# Patient Record
Sex: Male | Born: 1950 | Race: White | Hispanic: No | Marital: Married | Smoking: Never smoker
Health system: Southern US, Community
[De-identification: ages and names within clinical notes are randomized; demographics above are authoritative.]

## PROBLEM LIST (undated history)

## (undated) DIAGNOSIS — N529 Male erectile dysfunction, unspecified: Secondary | ICD-10-CM

## (undated) DIAGNOSIS — N433 Hydrocele, unspecified: Secondary | ICD-10-CM

## (undated) DIAGNOSIS — R001 Bradycardia, unspecified: Secondary | ICD-10-CM

## (undated) DIAGNOSIS — N486 Induration penis plastica: Secondary | ICD-10-CM

## (undated) DIAGNOSIS — H9190 Unspecified hearing loss, unspecified ear: Secondary | ICD-10-CM

## (undated) DIAGNOSIS — F419 Anxiety disorder, unspecified: Secondary | ICD-10-CM

## (undated) DIAGNOSIS — E7800 Pure hypercholesterolemia, unspecified: Secondary | ICD-10-CM

## (undated) DIAGNOSIS — F430 Acute stress reaction: Secondary | ICD-10-CM

## (undated) HISTORY — DX: Male erectile dysfunction, unspecified: N52.9

## (undated) HISTORY — DX: Anxiety disorder, unspecified: F41.9

## (undated) HISTORY — DX: Unspecified hearing loss, unspecified ear: H91.90

## (undated) HISTORY — DX: Acute stress reaction: F43.0

## (undated) HISTORY — DX: Pure hypercholesterolemia, unspecified: E78.00

## (undated) HISTORY — DX: Hydrocele, unspecified: N43.3

## (undated) HISTORY — DX: Induration penis plastica: N48.6

## (undated) HISTORY — DX: Bradycardia, unspecified: R00.1

---

## 2004-07-28 ENCOUNTER — Ambulatory Visit: Payer: Self-pay | Admitting: Gastroenterology

## 2004-08-18 ENCOUNTER — Ambulatory Visit: Payer: Self-pay | Admitting: Gastroenterology

## 2005-12-01 DIAGNOSIS — C4492 Squamous cell carcinoma of skin, unspecified: Secondary | ICD-10-CM

## 2005-12-01 HISTORY — DX: Squamous cell carcinoma of skin, unspecified: C44.92

## 2006-08-20 ENCOUNTER — Ambulatory Visit (HOSPITAL_BASED_OUTPATIENT_CLINIC_OR_DEPARTMENT_OTHER): Admission: RE | Admit: 2006-08-20 | Discharge: 2006-08-20 | Payer: Self-pay | Admitting: Urology

## 2009-07-01 ENCOUNTER — Encounter (INDEPENDENT_AMBULATORY_CARE_PROVIDER_SITE_OTHER): Payer: Self-pay | Admitting: *Deleted

## 2009-07-23 ENCOUNTER — Encounter (INDEPENDENT_AMBULATORY_CARE_PROVIDER_SITE_OTHER): Payer: Self-pay

## 2009-07-26 ENCOUNTER — Ambulatory Visit: Payer: Self-pay | Admitting: Gastroenterology

## 2010-05-27 NOTE — Miscellaneous (Signed)
Summary: Lec previsit  Clinical Lists Changes  Medications: Added new medication of MOVIPREP 100 GM  SOLR (PEG-KCL-NACL-NASULF-NA ASC-C) As per prep instructions. - Signed Rx of MOVIPREP 100 GM  SOLR (PEG-KCL-NACL-NASULF-NA ASC-C) As per prep instructions.;  #1 x 0;  Signed;  Entered by: Ulis Rias RN;  Authorized by: Louis Meckel MD;  Method used: Electronically to [id number]*, [address], [place], [place]  [address], Ph: [phone], Fax: [phone] Observations: Added new observation of NKA: T (07/26/2009 10:58)    Prescriptions: MOVIPREP 100 GM  SOLR (PEG-KCL-NACL-NASULF-NA ASC-C) As per prep instructions.  #1 x 0   Entered by:   Ulis Rias RN   Authorized by:   Louis Meckel MD   Signed by:   Ulis Rias RN on 07/26/2009   Method used:   Electronically to        [facility] [phone]* (retail)       [address] [place], [place]  [address]       Ph: [phone]       Fax: [phone]   RxID:   [id number]

## 2010-05-27 NOTE — Letter (Signed)
Summary: [facility] Instructions  [facility]  [address] [place], [place] [address]   Phone: [phone]  Fax: [phone]       Pearlie Chi [facility]    August 02, 1950    MRN: [id number]        Procedure Day /Date: Friday  08/09/09     Arrival Time:  9:00am      Procedure Time: 10:00am     Location of Procedure:                    Juliann Pares _  [facility] (4th Floor)                        PREPARATION FOR COLONOSCOPY WITH MOVIPREP   Starting 5 days prior to your procedure  Sunday 04/10 do not eat nuts, seeds, popcorn, corn, beans, peas,  salads, or any raw vegetables.  Do not take any fiber supplements (e.g. Metamucil, Citrucel, and Benefiber).  THE DAY BEFORE YOUR PROCEDURE         DATE:  04/14  DAY:  Thursday  1.  Drink clear liquids the entire day-NO SOLID FOOD  2.  Do not drink anything colored red or purple.  Avoid juices with pulp.  No orange juice.  3.  Drink at least 64 oz. (8 glasses) of fluid/clear liquids during the day to prevent dehydration and help the prep work efficiently.  CLEAR LIQUIDS INCLUDE: Water Jello Ice Popsicles Tea (sugar ok, no milk/cream) Powdered fruit flavored drinks Coffee (sugar ok, no milk/cream) Gatorade Juice: apple, white grape, white cranberry  Lemonade Clear bullion, consomm, broth Carbonated beverages (any kind) Strained chicken noodle soup Hard Candy                             4.  In the morning, mix first dose of MoviPrep solution:    Empty 1 Pouch A and 1 Pouch B into the disposable container    Add lukewarm drinking water to the top line of the container. Mix to dissolve    Refrigerate (mixed solution should be used within 24 hrs)  5.  Begin drinking the prep at 5:00 p.m. The MoviPrep container is divided by 4 marks.   Every 15 minutes drink the solution down to the next mark (approximately 8 oz) until the full liter is complete.   6.  Follow completed prep with 16 oz of clear liquid of your choice  (Nothing red or purple).  Continue to drink clear liquids until bedtime.  7.  Before going to bed, mix second dose of MoviPrep solution:    Empty 1 Pouch A and 1 Pouch B into the disposable container    Add lukewarm drinking water to the top line of the container. Mix to dissolve    Refrigerate  THE DAY OF YOUR PROCEDURE      DATE: 04/15  DAY: Friday  Beginning at  5:00 a.m. (5 hours before procedure):         1. Every 15 minutes, drink the solution down to the next mark (approx 8 oz) until the full liter is complete.  2. Follow completed prep with 16 oz. of clear liquid of your choice.    3. You may drink clear liquids until 8:00am  (2 HOURS BEFORE PROCEDURE).   MEDICATION INSTRUCTIONS  Unless otherwise instructed, you should take regular prescription medications with a small sip of water  as early as possible the morning of your procedure.        OTHER INSTRUCTIONS  You will need a responsible adult at least 61 years of age to accompany you and drive you home.   This person must remain in the waiting room during your procedure.  Wear loose fitting clothing that is easily removed.  Leave jewelry and other valuables at home.  However, you may wish to bring a book to read or  an iPod/MP3 player to listen to music as you wait for your procedure to start.  Remove all body piercing jewelry and leave at home.  Total time from sign-in until discharge is approximately 2-3 hours.  You should go home directly after your procedure and rest.  You can resume normal activities the  day after your procedure.  The day of your procedure you should not:   Drive   Make legal decisions   Operate machinery   Drink alcohol   Return to work  You will receive specific instructions about eating, activities and medications before you leave.    The above instructions have been reviewed and explained to me by   Ulis Rias RN  July 26, 2009 11:12 AM     I fully understand  and can verbalize these instructions _____________________________ Date _________

## 2010-05-27 NOTE — Letter (Signed)
Summary: Previsit letter  [facility]  [address] [place], [place] [address]   Phone: [phone]  Fax: [phone]       07/01/2009 MRN: [id number]  [facility] [address] [place], [place]  [address]  Dear Gerald Griffin,  Welcome to the Gastroenterology Division at [facility].    You are scheduled to see a nurse for your pre-procedure visit on 07-26-09 at 11:00a.m. on the 3rd floor at [facility], [address]  We ask that you try to arrive at our office 15 minutes prior to your appointment time to allow for check-in.  Your nurse visit will consist of discussing your medical and surgical history, your immediate family medical history, and your medications.    Please bring a complete list of all your medications or, if you prefer, bring the medication bottles and we will list them.  We will need to be aware of both prescribed and over the counter drugs.  We will need to know exact dosage information as well.  If you are on blood thinners (Coumadin, Plavix, Aggrenox, Ticlid, etc.) please call our office today/prior to your appointment, as we need to consult with your physician about holding your medication.   Please be prepared to read and sign documents such as consent forms, a financial agreement, and acknowledgement forms.  If necessary, and with your consent, a friend or relative is welcome to sit-in on the nurse visit with you.  Please bring your insurance card so that we may make a copy of it.  If your insurance requires a referral to see a specialist, please bring your referral form from your primary care physician.  No co-pay is required for this nurse visit.     If you cannot keep your appointment, please call [phone] to cancel or reschedule prior to your appointment date.  This allows [place] the opportunity to schedule an appointment for another patient in need of care.    Thank you for choosing [facility] for your medical needs.  We  appreciate the opportunity to care for you.  Please visit [place] at our website  to learn more about our practice.                     Sincerely.                                                                                                                   The Gastroenterology Division

## 2010-09-12 NOTE — Op Note (Signed)
Gerald Griffin, Gerald Griffin                 ACCOUNT NO.:  [id number]   MEDICAL RECORD NO.:  [id number]          PATIENT TYPE:  AMB   LOCATION:  [facility]                         FACILITY:  [facility]   PHYSICIAN:  Boston Service, M.D.DATE OF BIRTH:  Jun 14, 1950   DATE OF PROCEDURE:  08/20/2006  DATE OF DISCHARGE:                               OPERATIVE REPORT   PREOPERATIVE DIAGNOSIS:  Peyronie's disease, redundant foreskin,  balanitis, phimosis.   POSTOPERATIVE DIAGNOSES:  Peyronie's disease, redundant foreskin,  balanitis, phimosis.   PROCEDURE:  Circumcision.   ANESTHESIA:  General.   DRAINS:  None.   COMPLICATIONS:  None.   DESCRIPTION OF PROCEDURE:  The patient was prepped and draped in the  supine position after institution of an adequate level of general  anesthesia.  Penile block 0.25% lidocaine without epinephrine was  instituted circumferentially around the base.  Circumferential incision  was then made proximal to the subcoronal sulcus.  Similar incision was  made proximal to the original incision and a ring of fibrotic preputial  skin was removed in a parallel lines technique.  Subcutaneous tissue  was reapproximated with interrupted sutures of 2-0 Vicryl.  Skin was  reapproximated with interrupted sutures of 2-0 chromic.  Wound was  covered with bacitracin ointment, dry gauze and Coban tape.  The patient  was returned to recovery in satisfactory condition.           ______________________________  Boston Service, M.D.     RH/MEDQ  D:  08/20/2006  T:  08/20/2006  Job:  [id number]   cc:   Chales Salmon. Abigail Miyamoto, M.D.  Fax: [phone]

## 2010-11-12 ENCOUNTER — Telehealth: Payer: Self-pay | Admitting: *Deleted

## 2010-11-12 NOTE — Telephone Encounter (Signed)
Called pt and left message that recall colonoscopy is due

## 2010-11-19 NOTE — Telephone Encounter (Signed)
2ND ATTEMPT  TO CONTACT PT LEFT ANOTHER MESSAGE FOR PT

## 2011-07-30 ENCOUNTER — Encounter: Payer: Self-pay | Admitting: Gastroenterology

## 2014-05-11 ENCOUNTER — Other Ambulatory Visit: Payer: Self-pay | Admitting: Family Medicine

## 2014-05-11 DIAGNOSIS — Z136 Encounter for screening for cardiovascular disorders: Secondary | ICD-10-CM

## 2014-05-24 ENCOUNTER — Ambulatory Visit
Admission: RE | Admit: 2014-05-24 | Discharge: 2014-05-24 | Disposition: A | Discharge status: Home / Self Care | Payer: Medicare Other | Source: Ambulatory Visit | Attending: Family Medicine | Admitting: Family Medicine

## 2014-05-24 DIAGNOSIS — Z136 Encounter for screening for cardiovascular disorders: Secondary | ICD-10-CM

## 2015-04-02 DIAGNOSIS — C4492 Squamous cell carcinoma of skin, unspecified: Secondary | ICD-10-CM

## 2015-04-02 HISTORY — DX: Squamous cell carcinoma of skin, unspecified: C44.92

## 2015-08-22 ENCOUNTER — Other Ambulatory Visit: Payer: Self-pay | Admitting: Family Medicine

## 2015-08-22 DIAGNOSIS — M79605 Pain in left leg: Secondary | ICD-10-CM

## 2015-08-27 ENCOUNTER — Ambulatory Visit
Admission: RE | Admit: 2015-08-27 | Discharge: 2015-08-27 | Disposition: A | Discharge status: Home / Self Care | Payer: Medicare Other | Source: Ambulatory Visit | Attending: Family Medicine | Admitting: Family Medicine

## 2015-08-27 DIAGNOSIS — M79605 Pain in left leg: Secondary | ICD-10-CM

## 2015-08-28 ENCOUNTER — Other Ambulatory Visit: Payer: Medicare Other

## 2015-12-26 ENCOUNTER — Telehealth: Payer: Self-pay | Admitting: Cardiovascular Disease

## 2015-12-26 NOTE — Telephone Encounter (Signed)
Received records from [facility] for appointment on 01/23/16 with Dr Oval Linsey.  Records given to [facility] (medical records) for Dr Blenda Mounts schedule on 01/23/16. lp

## 2016-01-23 ENCOUNTER — Ambulatory Visit: Payer: Medicare Other | Admitting: Cardiovascular Disease

## 2016-02-20 ENCOUNTER — Ambulatory Visit: Payer: Medicare Other | Admitting: Cardiovascular Disease

## 2017-05-01 DIAGNOSIS — R69 Illness, unspecified: Secondary | ICD-10-CM | POA: Diagnosis not present

## 2017-05-31 DIAGNOSIS — D229 Melanocytic nevi, unspecified: Secondary | ICD-10-CM | POA: Diagnosis not present

## 2017-05-31 DIAGNOSIS — D692 Other nonthrombocytopenic purpura: Secondary | ICD-10-CM | POA: Diagnosis not present

## 2017-05-31 DIAGNOSIS — L570 Actinic keratosis: Secondary | ICD-10-CM | POA: Diagnosis not present

## 2017-07-05 DIAGNOSIS — H04121 Dry eye syndrome of right lacrimal gland: Secondary | ICD-10-CM | POA: Diagnosis not present

## 2017-07-05 DIAGNOSIS — H04122 Dry eye syndrome of left lacrimal gland: Secondary | ICD-10-CM | POA: Diagnosis not present

## 2017-07-14 DIAGNOSIS — R69 Illness, unspecified: Secondary | ICD-10-CM | POA: Diagnosis not present

## 2017-08-26 DIAGNOSIS — Z125 Encounter for screening for malignant neoplasm of prostate: Secondary | ICD-10-CM | POA: Diagnosis not present

## 2017-08-26 DIAGNOSIS — Z0000 Encounter for general adult medical examination without abnormal findings: Secondary | ICD-10-CM | POA: Diagnosis not present

## 2017-08-26 DIAGNOSIS — Z131 Encounter for screening for diabetes mellitus: Secondary | ICD-10-CM | POA: Diagnosis not present

## 2017-08-26 DIAGNOSIS — E7800 Pure hypercholesterolemia, unspecified: Secondary | ICD-10-CM | POA: Diagnosis not present

## 2017-09-28 DIAGNOSIS — K5730 Diverticulosis of large intestine without perforation or abscess without bleeding: Secondary | ICD-10-CM | POA: Diagnosis not present

## 2017-09-28 DIAGNOSIS — Z1211 Encounter for screening for malignant neoplasm of colon: Secondary | ICD-10-CM | POA: Diagnosis not present

## 2017-09-28 DIAGNOSIS — K648 Other hemorrhoids: Secondary | ICD-10-CM | POA: Diagnosis not present

## 2017-09-28 DIAGNOSIS — Z800 Family history of malignant neoplasm of digestive organs: Secondary | ICD-10-CM | POA: Diagnosis not present

## 2017-11-01 DIAGNOSIS — H04121 Dry eye syndrome of right lacrimal gland: Secondary | ICD-10-CM | POA: Diagnosis not present

## 2018-01-13 DIAGNOSIS — R69 Illness, unspecified: Secondary | ICD-10-CM | POA: Diagnosis not present

## 2018-03-03 DIAGNOSIS — H26102 Unspecified traumatic cataract, left eye: Secondary | ICD-10-CM | POA: Diagnosis not present

## 2018-03-03 DIAGNOSIS — H04121 Dry eye syndrome of right lacrimal gland: Secondary | ICD-10-CM | POA: Diagnosis not present

## 2018-03-03 DIAGNOSIS — H52203 Unspecified astigmatism, bilateral: Secondary | ICD-10-CM | POA: Diagnosis not present

## 2018-03-10 DIAGNOSIS — R69 Illness, unspecified: Secondary | ICD-10-CM | POA: Diagnosis not present

## 2018-03-21 DIAGNOSIS — R69 Illness, unspecified: Secondary | ICD-10-CM | POA: Diagnosis not present

## 2018-06-09 DIAGNOSIS — G514 Facial myokymia: Secondary | ICD-10-CM | POA: Diagnosis not present

## 2018-06-14 DIAGNOSIS — H0100A Unspecified blepharitis right eye, upper and lower eyelids: Secondary | ICD-10-CM | POA: Diagnosis not present

## 2018-06-14 DIAGNOSIS — R253 Fasciculation: Secondary | ICD-10-CM | POA: Diagnosis not present

## 2018-06-14 DIAGNOSIS — H0100B Unspecified blepharitis left eye, upper and lower eyelids: Secondary | ICD-10-CM | POA: Diagnosis not present

## 2018-06-14 DIAGNOSIS — H2513 Age-related nuclear cataract, bilateral: Secondary | ICD-10-CM | POA: Diagnosis not present

## 2018-06-14 DIAGNOSIS — H04123 Dry eye syndrome of bilateral lacrimal glands: Secondary | ICD-10-CM | POA: Diagnosis not present

## 2018-07-13 DIAGNOSIS — L03031 Cellulitis of right toe: Secondary | ICD-10-CM | POA: Diagnosis not present

## 2018-09-15 DIAGNOSIS — Z131 Encounter for screening for diabetes mellitus: Secondary | ICD-10-CM | POA: Diagnosis not present

## 2018-09-15 DIAGNOSIS — Z0000 Encounter for general adult medical examination without abnormal findings: Secondary | ICD-10-CM | POA: Diagnosis not present

## 2018-09-15 DIAGNOSIS — N529 Male erectile dysfunction, unspecified: Secondary | ICD-10-CM | POA: Diagnosis not present

## 2018-09-15 DIAGNOSIS — M25519 Pain in unspecified shoulder: Secondary | ICD-10-CM | POA: Diagnosis not present

## 2018-09-15 DIAGNOSIS — E7800 Pure hypercholesterolemia, unspecified: Secondary | ICD-10-CM | POA: Diagnosis not present

## 2018-09-15 DIAGNOSIS — N433 Hydrocele, unspecified: Secondary | ICD-10-CM | POA: Diagnosis not present

## 2018-09-15 DIAGNOSIS — Z125 Encounter for screening for malignant neoplasm of prostate: Secondary | ICD-10-CM | POA: Diagnosis not present

## 2018-10-03 DIAGNOSIS — M25512 Pain in left shoulder: Secondary | ICD-10-CM | POA: Diagnosis not present

## 2018-10-03 DIAGNOSIS — M7502 Adhesive capsulitis of left shoulder: Secondary | ICD-10-CM | POA: Diagnosis not present

## 2018-10-06 DIAGNOSIS — H0100B Unspecified blepharitis left eye, upper and lower eyelids: Secondary | ICD-10-CM | POA: Diagnosis not present

## 2018-10-06 DIAGNOSIS — H04123 Dry eye syndrome of bilateral lacrimal glands: Secondary | ICD-10-CM | POA: Diagnosis not present

## 2018-10-06 DIAGNOSIS — H0100A Unspecified blepharitis right eye, upper and lower eyelids: Secondary | ICD-10-CM | POA: Diagnosis not present

## 2018-10-18 DIAGNOSIS — H04123 Dry eye syndrome of bilateral lacrimal glands: Secondary | ICD-10-CM | POA: Diagnosis not present

## 2018-11-14 DIAGNOSIS — M7502 Adhesive capsulitis of left shoulder: Secondary | ICD-10-CM | POA: Diagnosis not present

## 2018-12-14 DIAGNOSIS — R69 Illness, unspecified: Secondary | ICD-10-CM | POA: Diagnosis not present

## 2019-01-09 DIAGNOSIS — R69 Illness, unspecified: Secondary | ICD-10-CM | POA: Diagnosis not present

## 2019-03-09 DIAGNOSIS — H04121 Dry eye syndrome of right lacrimal gland: Secondary | ICD-10-CM | POA: Diagnosis not present

## 2019-03-09 DIAGNOSIS — H52203 Unspecified astigmatism, bilateral: Secondary | ICD-10-CM | POA: Diagnosis not present

## 2019-03-09 DIAGNOSIS — H5212 Myopia, left eye: Secondary | ICD-10-CM | POA: Diagnosis not present

## 2019-03-09 DIAGNOSIS — H2513 Age-related nuclear cataract, bilateral: Secondary | ICD-10-CM | POA: Diagnosis not present

## 2019-03-13 DIAGNOSIS — S90211A Contusion of right great toe with damage to nail, initial encounter: Secondary | ICD-10-CM | POA: Diagnosis not present

## 2019-04-24 DIAGNOSIS — L03031 Cellulitis of right toe: Secondary | ICD-10-CM | POA: Diagnosis not present

## 2019-04-24 DIAGNOSIS — L853 Xerosis cutis: Secondary | ICD-10-CM | POA: Diagnosis not present

## 2019-07-27 DIAGNOSIS — H10411 Chronic giant papillary conjunctivitis, right eye: Secondary | ICD-10-CM | POA: Diagnosis not present

## 2019-09-07 DIAGNOSIS — N486 Induration penis plastica: Secondary | ICD-10-CM | POA: Diagnosis not present

## 2019-09-07 DIAGNOSIS — N433 Hydrocele, unspecified: Secondary | ICD-10-CM | POA: Diagnosis not present

## 2019-09-08 DIAGNOSIS — Z8249 Family history of ischemic heart disease and other diseases of the circulatory system: Secondary | ICD-10-CM | POA: Diagnosis not present

## 2019-09-08 DIAGNOSIS — Z803 Family history of malignant neoplasm of breast: Secondary | ICD-10-CM | POA: Diagnosis not present

## 2019-09-08 DIAGNOSIS — R030 Elevated blood-pressure reading, without diagnosis of hypertension: Secondary | ICD-10-CM | POA: Diagnosis not present

## 2019-09-08 DIAGNOSIS — H04129 Dry eye syndrome of unspecified lacrimal gland: Secondary | ICD-10-CM | POA: Diagnosis not present

## 2019-09-11 ENCOUNTER — Other Ambulatory Visit: Payer: Self-pay | Admitting: Urology

## 2019-09-11 DIAGNOSIS — N433 Hydrocele, unspecified: Secondary | ICD-10-CM

## 2019-09-14 DIAGNOSIS — H0100A Unspecified blepharitis right eye, upper and lower eyelids: Secondary | ICD-10-CM | POA: Diagnosis not present

## 2019-09-14 DIAGNOSIS — H04123 Dry eye syndrome of bilateral lacrimal glands: Secondary | ICD-10-CM | POA: Diagnosis not present

## 2019-09-14 DIAGNOSIS — H0100B Unspecified blepharitis left eye, upper and lower eyelids: Secondary | ICD-10-CM | POA: Diagnosis not present

## 2019-09-14 DIAGNOSIS — E7800 Pure hypercholesterolemia, unspecified: Secondary | ICD-10-CM | POA: Diagnosis not present

## 2019-09-14 DIAGNOSIS — Z131 Encounter for screening for diabetes mellitus: Secondary | ICD-10-CM | POA: Diagnosis not present

## 2019-09-14 DIAGNOSIS — Z125 Encounter for screening for malignant neoplasm of prostate: Secondary | ICD-10-CM | POA: Diagnosis not present

## 2019-09-18 DIAGNOSIS — Z0000 Encounter for general adult medical examination without abnormal findings: Secondary | ICD-10-CM | POA: Diagnosis not present

## 2019-09-18 DIAGNOSIS — N529 Male erectile dysfunction, unspecified: Secondary | ICD-10-CM | POA: Diagnosis not present

## 2019-09-18 DIAGNOSIS — R69 Illness, unspecified: Secondary | ICD-10-CM | POA: Diagnosis not present

## 2019-09-22 ENCOUNTER — Other Ambulatory Visit: Payer: Medicare Other

## 2019-09-26 DIAGNOSIS — R69 Illness, unspecified: Secondary | ICD-10-CM | POA: Diagnosis not present

## 2019-09-29 ENCOUNTER — Ambulatory Visit
Admission: RE | Admit: 2019-09-29 | Discharge: 2019-09-29 | Disposition: A | Discharge status: Home / Self Care | Payer: Medicare HMO | Source: Ambulatory Visit | Attending: Urology | Admitting: Urology

## 2019-09-29 DIAGNOSIS — N433 Hydrocele, unspecified: Secondary | ICD-10-CM

## 2019-09-29 DIAGNOSIS — N503 Cyst of epididymis: Secondary | ICD-10-CM | POA: Diagnosis not present

## 2019-10-03 DIAGNOSIS — R69 Illness, unspecified: Secondary | ICD-10-CM | POA: Diagnosis not present

## 2019-10-11 DIAGNOSIS — R69 Illness, unspecified: Secondary | ICD-10-CM | POA: Diagnosis not present

## 2019-12-20 DIAGNOSIS — R69 Illness, unspecified: Secondary | ICD-10-CM | POA: Diagnosis not present

## 2019-12-20 DIAGNOSIS — F419 Anxiety disorder, unspecified: Secondary | ICD-10-CM | POA: Diagnosis not present

## 2019-12-20 DIAGNOSIS — R42 Dizziness and giddiness: Secondary | ICD-10-CM | POA: Diagnosis not present

## 2020-01-31 DIAGNOSIS — R69 Illness, unspecified: Secondary | ICD-10-CM | POA: Diagnosis not present

## 2020-03-14 DIAGNOSIS — H52203 Unspecified astigmatism, bilateral: Secondary | ICD-10-CM | POA: Diagnosis not present

## 2020-03-14 DIAGNOSIS — H2513 Age-related nuclear cataract, bilateral: Secondary | ICD-10-CM | POA: Diagnosis not present

## 2020-03-14 DIAGNOSIS — H04123 Dry eye syndrome of bilateral lacrimal glands: Secondary | ICD-10-CM | POA: Diagnosis not present

## 2020-07-22 DIAGNOSIS — H04129 Dry eye syndrome of unspecified lacrimal gland: Secondary | ICD-10-CM | POA: Diagnosis not present

## 2020-07-22 DIAGNOSIS — Z87891 Personal history of nicotine dependence: Secondary | ICD-10-CM | POA: Diagnosis not present

## 2020-07-22 DIAGNOSIS — Z8249 Family history of ischemic heart disease and other diseases of the circulatory system: Secondary | ICD-10-CM | POA: Diagnosis not present

## 2020-07-22 DIAGNOSIS — R030 Elevated blood-pressure reading, without diagnosis of hypertension: Secondary | ICD-10-CM | POA: Diagnosis not present

## 2020-07-22 DIAGNOSIS — N529 Male erectile dysfunction, unspecified: Secondary | ICD-10-CM | POA: Diagnosis not present

## 2020-07-22 DIAGNOSIS — Z809 Family history of malignant neoplasm, unspecified: Secondary | ICD-10-CM | POA: Diagnosis not present

## 2020-09-27 DIAGNOSIS — R972 Elevated prostate specific antigen [PSA]: Secondary | ICD-10-CM | POA: Diagnosis not present

## 2020-09-27 DIAGNOSIS — Z23 Encounter for immunization: Secondary | ICD-10-CM | POA: Diagnosis not present

## 2020-09-27 DIAGNOSIS — E7800 Pure hypercholesterolemia, unspecified: Secondary | ICD-10-CM | POA: Diagnosis not present

## 2020-09-27 DIAGNOSIS — R69 Illness, unspecified: Secondary | ICD-10-CM | POA: Diagnosis not present

## 2020-09-27 DIAGNOSIS — Z79899 Other long term (current) drug therapy: Secondary | ICD-10-CM | POA: Diagnosis not present

## 2020-09-27 DIAGNOSIS — Z0000 Encounter for general adult medical examination without abnormal findings: Secondary | ICD-10-CM | POA: Diagnosis not present

## 2020-09-27 DIAGNOSIS — N529 Male erectile dysfunction, unspecified: Secondary | ICD-10-CM | POA: Diagnosis not present

## 2020-09-27 DIAGNOSIS — Z125 Encounter for screening for malignant neoplasm of prostate: Secondary | ICD-10-CM | POA: Diagnosis not present

## 2020-10-18 DIAGNOSIS — R972 Elevated prostate specific antigen [PSA]: Secondary | ICD-10-CM | POA: Diagnosis not present

## 2020-11-29 DIAGNOSIS — R972 Elevated prostate specific antigen [PSA]: Secondary | ICD-10-CM | POA: Diagnosis not present

## 2020-12-10 ENCOUNTER — Encounter: Payer: Self-pay | Admitting: Dermatology

## 2020-12-10 ENCOUNTER — Other Ambulatory Visit: Payer: Self-pay

## 2020-12-10 ENCOUNTER — Ambulatory Visit: Payer: Medicare HMO | Admitting: Dermatology

## 2020-12-10 DIAGNOSIS — Z1283 Encounter for screening for malignant neoplasm of skin: Secondary | ICD-10-CM | POA: Diagnosis not present

## 2020-12-10 DIAGNOSIS — Z85828 Personal history of other malignant neoplasm of skin: Secondary | ICD-10-CM | POA: Diagnosis not present

## 2020-12-10 DIAGNOSIS — D485 Neoplasm of uncertain behavior of skin: Secondary | ICD-10-CM

## 2020-12-10 DIAGNOSIS — L570 Actinic keratosis: Secondary | ICD-10-CM | POA: Diagnosis not present

## 2020-12-10 DIAGNOSIS — C44519 Basal cell carcinoma of skin of other part of trunk: Secondary | ICD-10-CM

## 2020-12-10 NOTE — Patient Instructions (Addendum)
Biopsy, Surgery (Curettage) & Surgery (Excision) Aftercare Instructions  1. Okay to remove bandage in 24 hours  2. Wash area with soap and water  3. Apply Vaseline to area twice daily until healed (Not Neosporin)  4. Okay to cover with a Band-Aid to decrease the chance of infection or prevent irritation from clothing; also it's okay to uncover lesion at home.  5. Suture instructions: return to our office in 7-10 or 10-14 days for a nurse visit for suture removal. Variable healing with sutures, if pain or itching occurs call our office. It's okay to shower or bathe 24 hours after sutures are given.  6. The following risks may occur after a biopsy, curettage or excision: bleeding, scarring, discoloration, recurrence, infection (redness, yellow drainage, pain or swelling).  7. For questions, concerns and results call our office at [place] before 4pm & Friday before 3pm. Biopsy results will be available in 1 week.    Aminolevulinic Acid topical solution What is this medication? AMINOLEVULINIC ACID (a MEE noe LEV ue LIN ik AS id) is used with photodynamic therapy (PDT) to treat certain skin conditions like actinic keratosis. PDT is a two-step process that requires application of a medicine and then exposure to acertain type of light. This medicine may be used for other purposes; ask your health care provider orpharmacist if you have questions. COMMON BRAND NAME(S): Levulan Kerastick What should I tell my care team before I take this medication? They need to know if you have any of these conditions: bleeding disorders porphyria skin conditions or sensitivity an unusual or allergic reaction to aminolevulinic acid, porphyrins, other medicines, foods, dyes, or preservatives pregnant or trying to get pregnant breast-feeding How should I use this medication? This medicine is for external use only. There are 2 steps in the use of this medicine. First, it is applied to the affected areas  of skin by a health care provider in a hospital or clinic. The second step happens several hours after application of the medicine. You will have to return to the hospital or clinic for the second step. In the second step, the treated area is exposed to aspecial blue light. The treatment may be repeated in 8 weeks. Talk to your health care provider about the use of this medicine in children.Special care may be needed. Overdosage: If you think you have taken too much of this medicine contact apoison control center or emergency room at once. NOTE: This medicine is only for you. Do not share this medicine with others. What if I miss a dose? It is important not to miss a scheduled appointment. Keep follow-up appointments. The timing of the application of the medicine determines when the light treatment may be given. Call your health care provider if you are unable to keep an appointment. If you are not exposed to the blue light, continue toavoid exposure to sunlight or prolonged bright light for at least 40 hours. What may interact with this medication? This medicine will make you sensitive to the sun. This effect may be increasedby other medicines that also cause sensitivity to the sun such as: certain diuretics, like chlorothiazide, hydrochlorothiazide, indapamide, metolazone certain medicines for diabetes, like glipizide or glyburide certain medicines for infection like fluoroquinolones, griseofulvin, sulfonamides, or tetracyclines phenothiazines like chlorpromazine, mesoridazine, prochlorperazine, thioridazine St. John's Wort vitamin A and vitamin A-like medicines and creams vitamin E This list may not describe all possible interactions. Give your health care provider a list of all the medicines, herbs, non-prescription drugs, or dietary  supplements you use. Also tell them if you smoke, drink alcohol, or use illegaldrugs. Some items may interact with your medicine. What should I watch for while using  this medication? Visit your health care provider for regular checks on your progress. Tell your health care provider if your symptoms do not start to get better or if they getworse. Do not get this medicine in your eyes. If you do, rinse out with plenty of cooltap water. This medicine can make you more sensitive to the sun or bright indoor light. Avoid exposing skin to sunlight and bright indoor lights for 40 hours. If you cannot avoid being in the sun, wear a wide-brimmed hat or other head cover and protective clothing. Sunscreens will NOT protect against these reactions. Donot use sun lamps, tanning beds/booths or bright indoor lights. What side effects may I notice from receiving this medication? Side effects that you should report to your doctor or health care professionalas soon as possible: allergic reactions (skin rash, itching or hives; swelling of the face, lips, or tongue) blistering, peeling, or loosening of the skin confusion loss of memory Side effects that usually do not require medical attention (report to yourdoctor or health care professional if they continue or are bothersome): headache pain, redness, burning, or irritation at site where applied This list may not describe all possible side effects. Call your doctor for medical advice about side effects. You may report side effects to FDA at1-800-FDA-1088. Where should I keep my medication? This medicine is given in a hospital or clinic. It will not be stored at home. NOTE: This sheet is a summary. It may not cover all possible information. If you have questions about this medicine, talk to your doctor, pharmacist, orhealth care provider.  2022 Elsevier/Gold Standard (2019-09-21 16:06:53)

## 2020-12-11 DIAGNOSIS — M25511 Pain in right shoulder: Secondary | ICD-10-CM | POA: Diagnosis not present

## 2020-12-20 DIAGNOSIS — M19071 Primary osteoarthritis, right ankle and foot: Secondary | ICD-10-CM | POA: Diagnosis not present

## 2020-12-20 DIAGNOSIS — M792 Neuralgia and neuritis, unspecified: Secondary | ICD-10-CM | POA: Diagnosis not present

## 2020-12-20 DIAGNOSIS — M19072 Primary osteoarthritis, left ankle and foot: Secondary | ICD-10-CM | POA: Diagnosis not present

## 2020-12-22 ENCOUNTER — Encounter: Payer: Self-pay | Admitting: Dermatology

## 2020-12-22 NOTE — Progress Notes (Addendum)
New Patient   Subjective  Gerald Griffin is a 70 y.o. male who presents for the following: New Patient (Initial Visit) (Patient here today for lesions on his face that are scaling x couple of months no bleeding. Personal history of non mole skin cancer. No personal history of atypical moles or melanoma. No family history of atypical moles, melanoma or non mole skin cancer. ).  General skin examination, history of skin cancer, several crusts on face Location:  Duration:  Quality:  Associated Signs/Symptoms: Modifying Factors:  Severity:  Timing: Context:    The following portions of the chart were reviewed this encounter and updated as appropriate:  Tobacco  Allergies  Meds  Problems  Med Hx  Surg Hx  Fam Hx      Objective  Well appearing patient in no apparent distress; mood and affect are within normal limits. Waist Up General skin examination, no atypical pigmented lesions.  1 possible carcinoma right upper back will be biopsied and treated.  No sign of recurrent carcinoma.  Left Ala Nasi, Left Parotid Area (2), Right Forehead, Right Temple Gritty 2 to 4 mm pink crusts  Right Upper Back 9 mm waxy pink flat papule compatible with superficial BCC        A full examination was performed including scalp, head, eyes, ears, nose, lips, neck, chest, axillae, abdomen, back, buttocks, bilateral upper extremities, bilateral lower extremities, hands, feet, fingers, toes, fingernails, and toenails. All findings within normal limits unless otherwise noted below.   Assessment & Plan  Skin exam for malignant neoplasm Waist Up  Yearly skin check, encouraged to self examine twice annually.  AK (actinic keratosis) (5) Right Temple; Left Ala Nasi; Left Parotid Area (2); Right Forehead  Destruction of lesion - Left Ala Nasi, Left Parotid Area, Right Forehead, Right Temple Complexity: simple   Destruction method: cryotherapy   Informed consent: discussed and consent obtained    Timeout:  patient name, date of birth, surgical site, and procedure verified Lesion destroyed using liquid nitrogen: Yes   Cryotherapy cycles:  5 Outcome: patient tolerated procedure well with no complications   Post-procedure details: wound care instructions given    Basal cell carcinoma (BCC) of skin of other part of torso Right Upper Back  Skin / nail biopsy Type of biopsy: tangential   Informed consent: discussed and consent obtained   Timeout: patient name, date of birth, surgical site, and procedure verified   Procedure prep:  Patient was prepped and draped in usual sterile fashion (Non sterile) Prep type:  Chlorhexidine Anesthesia: the lesion was anesthetized in a standard fashion   Anesthetic:  1% lidocaine w/ epinephrine 1-100,000 local infiltration Instrument used: flexible razor blade   Hemostasis achieved with: ferric subsulfate   Outcome: patient tolerated procedure well   Post-procedure details: sterile dressing applied and wound care instructions given   Dressing type: bandage and petrolatum    Destruction of lesion Complexity: simple   Destruction method: electrodesiccation and curettage   Informed consent: discussed and consent obtained   Timeout:  patient name, date of birth, surgical site, and procedure verified Anesthesia: the lesion was anesthetized in a standard fashion   Anesthetic:  1% lidocaine w/ epinephrine 1-100,000 local infiltration Curettage performed in three different directions: Yes     Electrodesiccation performed over the curetted area: No   Curettage cycles:  1 Lesion length (cm):  1.1 Lesion width (cm):  1.1 Margin per side (cm):  0 Final wound size (cm):  1.1 Hemostasis achieved  with:  ferric subsulfate Outcome: patient tolerated procedure well with no complications   Post-procedure details: sterile dressing applied and wound care instructions given   Dressing type: bandage and petrolatum   Additional details:  Wound innoculated with  5 fluorouracil solution.  Specimen 1 - Surgical pathology Differential Diagnosis: R/O BCC vs SCC - treated after biopsy  Check Margins: No  After shave biopsy the base and margins were treated with curettage plus cautery

## 2020-12-25 DIAGNOSIS — G609 Hereditary and idiopathic neuropathy, unspecified: Secondary | ICD-10-CM | POA: Diagnosis not present

## 2020-12-25 DIAGNOSIS — M2021 Hallux rigidus, right foot: Secondary | ICD-10-CM | POA: Diagnosis not present

## 2020-12-25 DIAGNOSIS — M2022 Hallux rigidus, left foot: Secondary | ICD-10-CM | POA: Diagnosis not present

## 2021-03-27 DIAGNOSIS — H2513 Age-related nuclear cataract, bilateral: Secondary | ICD-10-CM | POA: Diagnosis not present

## 2021-03-27 DIAGNOSIS — H52203 Unspecified astigmatism, bilateral: Secondary | ICD-10-CM | POA: Diagnosis not present

## 2021-03-27 DIAGNOSIS — H04123 Dry eye syndrome of bilateral lacrimal glands: Secondary | ICD-10-CM | POA: Diagnosis not present

## 2021-04-10 DIAGNOSIS — R030 Elevated blood-pressure reading, without diagnosis of hypertension: Secondary | ICD-10-CM | POA: Diagnosis not present

## 2021-06-18 DIAGNOSIS — E663 Overweight: Secondary | ICD-10-CM | POA: Diagnosis not present

## 2021-06-18 DIAGNOSIS — R030 Elevated blood-pressure reading, without diagnosis of hypertension: Secondary | ICD-10-CM | POA: Diagnosis not present

## 2021-06-18 DIAGNOSIS — M25561 Pain in right knee: Secondary | ICD-10-CM | POA: Diagnosis not present

## 2021-06-25 ENCOUNTER — Ambulatory Visit
Admission: RE | Admit: 2021-06-25 | Discharge: 2021-06-25 | Disposition: A | Discharge status: Home / Self Care | Payer: Medicare HMO | Source: Ambulatory Visit | Attending: Sports Medicine | Admitting: Sports Medicine

## 2021-06-25 ENCOUNTER — Other Ambulatory Visit: Payer: Self-pay | Admitting: Sports Medicine

## 2021-06-25 DIAGNOSIS — M25561 Pain in right knee: Secondary | ICD-10-CM

## 2021-06-25 DIAGNOSIS — M1711 Unilateral primary osteoarthritis, right knee: Secondary | ICD-10-CM | POA: Diagnosis not present

## 2021-09-06 DIAGNOSIS — Z87891 Personal history of nicotine dependence: Secondary | ICD-10-CM | POA: Diagnosis not present

## 2021-09-06 DIAGNOSIS — H04129 Dry eye syndrome of unspecified lacrimal gland: Secondary | ICD-10-CM | POA: Diagnosis not present

## 2021-09-06 DIAGNOSIS — R030 Elevated blood-pressure reading, without diagnosis of hypertension: Secondary | ICD-10-CM | POA: Diagnosis not present

## 2021-09-06 DIAGNOSIS — N529 Male erectile dysfunction, unspecified: Secondary | ICD-10-CM | POA: Diagnosis not present

## 2021-09-06 DIAGNOSIS — Z85828 Personal history of other malignant neoplasm of skin: Secondary | ICD-10-CM | POA: Diagnosis not present

## 2021-10-06 DIAGNOSIS — E7800 Pure hypercholesterolemia, unspecified: Secondary | ICD-10-CM | POA: Diagnosis not present

## 2021-10-06 DIAGNOSIS — Z79899 Other long term (current) drug therapy: Secondary | ICD-10-CM | POA: Diagnosis not present

## 2021-10-09 DIAGNOSIS — Z131 Encounter for screening for diabetes mellitus: Secondary | ICD-10-CM | POA: Diagnosis not present

## 2021-10-09 DIAGNOSIS — E7800 Pure hypercholesterolemia, unspecified: Secondary | ICD-10-CM | POA: Diagnosis not present

## 2021-10-09 DIAGNOSIS — R69 Illness, unspecified: Secondary | ICD-10-CM | POA: Diagnosis not present

## 2021-10-09 DIAGNOSIS — F411 Generalized anxiety disorder: Secondary | ICD-10-CM | POA: Diagnosis not present

## 2021-10-09 DIAGNOSIS — Z125 Encounter for screening for malignant neoplasm of prostate: Secondary | ICD-10-CM | POA: Diagnosis not present

## 2021-10-09 DIAGNOSIS — Z0000 Encounter for general adult medical examination without abnormal findings: Secondary | ICD-10-CM | POA: Diagnosis not present

## 2021-10-09 DIAGNOSIS — N529 Male erectile dysfunction, unspecified: Secondary | ICD-10-CM | POA: Diagnosis not present

## 2021-12-02 DIAGNOSIS — R972 Elevated prostate specific antigen [PSA]: Secondary | ICD-10-CM | POA: Diagnosis not present

## 2021-12-05 DIAGNOSIS — N486 Induration penis plastica: Secondary | ICD-10-CM | POA: Diagnosis not present

## 2021-12-05 DIAGNOSIS — R972 Elevated prostate specific antigen [PSA]: Secondary | ICD-10-CM | POA: Diagnosis not present

## 2021-12-10 ENCOUNTER — Ambulatory Visit: Payer: Medicare HMO | Admitting: Dermatology

## 2022-01-12 DIAGNOSIS — I499 Cardiac arrhythmia, unspecified: Secondary | ICD-10-CM | POA: Diagnosis not present

## 2022-01-12 DIAGNOSIS — E663 Overweight: Secondary | ICD-10-CM | POA: Diagnosis not present

## 2022-01-12 DIAGNOSIS — R1013 Epigastric pain: Secondary | ICD-10-CM | POA: Diagnosis not present

## 2022-01-12 DIAGNOSIS — R001 Bradycardia, unspecified: Secondary | ICD-10-CM | POA: Diagnosis not present

## 2022-02-04 ENCOUNTER — Other Ambulatory Visit (HOSPITAL_BASED_OUTPATIENT_CLINIC_OR_DEPARTMENT_OTHER): Payer: Self-pay

## 2022-02-04 MED ORDER — PREVNAR 20 0.5 ML IM SUSY
PREFILLED_SYRINGE | INTRAMUSCULAR | 0 refills | Status: DC
  Filled 2022-02-04: qty 0.5, 1d supply, fill #0

## 2022-02-13 ENCOUNTER — Ambulatory Visit: Payer: Medicare HMO | Attending: Internal Medicine | Admitting: Internal Medicine

## 2022-02-13 ENCOUNTER — Encounter: Payer: Self-pay | Admitting: Internal Medicine

## 2022-02-13 VITALS — BP 162/64 | HR 52 | Ht 72.0 in | Wt 195.8 lb

## 2022-02-13 DIAGNOSIS — I493 Ventricular premature depolarization: Secondary | ICD-10-CM

## 2022-02-13 DIAGNOSIS — R0789 Other chest pain: Secondary | ICD-10-CM | POA: Diagnosis not present

## 2022-02-13 MED ORDER — CARVEDILOL 3.125 MG PO TABS: 3.1250 mg | ORAL_TABLET | Freq: Two times a day (BID) | ORAL | 3 refills | Status: DC

## 2022-02-13 NOTE — Patient Instructions (Signed)
Medication Instructions:  Your physician has recommended you make the following change in your medication:  START: carvedilol (Coreg) 3.125 mg by mouth twice daily- -START AFTER YOU WEAR HEART MONITOR  *If you need a refill on your cardiac medications before your next appointment, please call your pharmacy*   Lab Work: NONE If you have labs (blood work) drawn today and your tests are completely normal, you will receive your results only by: [place] (if you have MyChart) OR A paper copy in the mail If you have any lab test that is abnormal or we need to change your treatment, we will call you to review the results.   Testing/Procedures: Your physician has requested that you have an echocardiogram. Echocardiography is a painless test that uses sound waves to create images of your heart. It provides your doctor with information about the size and shape of your heart and how well your heart's chambers and valves are working. This procedure takes approximately one hour. There are no restrictions for this procedure. Please do NOT wear cologne, perfume, aftershave, or lotions (deodorant is allowed). Please arrive 15 minutes prior to your appointment time.  Your physician has requested that you have en exercise stress myoview. For further information please visit [email or web address]. Please follow instruction sheet, as given.   You are scheduled for a Myocardial Perfusion Imaging Study. Please arrive 15 minutes prior to your appointment time for registration and insurance purposes.   The test will take approximately 3 to 4 hours to complete; you may bring reading material.  If someone comes with you to your appointment, they will need to remain in the main lobby due to limited space in the testing area.    How to prepare for your Myocardial Perfusion Test: Do not eat or drink 3 hours prior to your test, except you may have water. Do not consume products containing caffeine (regular or  decaffeinated) 12 hours prior to your test. (ex: coffee, chocolate, sodas, tea). Do bring a list of your current medications with you.  If not listed below, you may take your medications as normal. DO NOT TAKE Carvedilol (Coreg) 24 hours prior to test  Do wear comfortable clothes (no dresses or overalls) and walking shoes, tennis shoes preferred (No heels or open toe shoes are allowed). Do NOT wear cologne, perfume, aftershave, or lotions (deodorant is allowed). If these instructions are not followed, your test will have to be rescheduled.  If you cannot keep your appointment, please provide 24 hours notification to the Nuclear Lab, to avoid a possible $50 charge to your account.      Your physician has requested that you wear a 3 day heart monitor.    Follow-Up: At [facility], you and your health needs are our priority.  As part of our continuing mission to provide you with exceptional heart care, we have created designated Provider Care Teams.  These Care Teams include your primary Cardiologist (physician) and Advanced Practice Providers (APPs -  Physician Assistants and Nurse Practitioners) who all work together to provide you with the care you need, when you need it.  We recommend signing up for the patient portal called "MyChart".  Sign up information is provided on this After Visit Summary.  MyChart is used to connect with patients for Virtual Visits (Telemedicine).  Patients are able to view lab/test results, encounter notes, upcoming appointments, etc.  Non-urgent messages can be sent to your provider as well.   To learn more about what you can  do with MyChart, go to [email or web address].    Your next appointment:   3-4 month(s)  The format for your next appointment:   In Person  Provider:   Werner Lean, MD     Other Instructions ZIO XT- Long Term Monitor Instructions  Your physician has requested you wear a ZIO patch monitor for 3 days.  This is a  single patch monitor. [employer] supplies one patch monitor per enrollment. Additional stickers are not available. Please do not apply patch if you will be having a Nuclear Stress Test,  Echocardiogram, Cardiac CT, MRI, or Chest Xray during the period you would be wearing the  monitor. The patch cannot be worn during these tests. You cannot remove and re-apply the  ZIO XT patch monitor.  Your ZIO patch monitor will be mailed 3 day USPS to your address on file. It may take 3-5 days  to receive your monitor after you have been enrolled.  Once you have received your monitor, please review the enclosed instructions. Your monitor  has already been registered assigning a specific monitor serial # to you.  Billing and Patient Assistance Program Information  We have supplied [employer] with any of your insurance information on file for billing purposes. [employer] offers a sliding scale Patient Assistance Program for patients that do not have  insurance, or whose insurance does not completely cover the cost of the ZIO monitor.  You must apply for the Patient Assistance Program to qualify for this discounted rate.  To apply, please call [employer] at [phone], select option 4, select option 2, ask to apply for  Patient Assistance Program. Theodore Demark will ask your household income, and how many people  are in your household. They will quote your out-of-pocket cost based on that information.  [employer] will also be able to set up a 4-month interest-free payment plan if needed.  Applying the monitor   Shave hair from upper left chest.  Hold abrader disc by orange tab. Rub abrader in 40 strokes over the upper left chest as  indicated in your monitor instructions.  Clean area with 4 enclosed alcohol pads. Let dry.  Apply patch as indicated in monitor instructions. Patch will be placed under collarbone on left  side of chest with arrow pointing upward.  Rub patch adhesive wings for 2 minutes. Remove white label  marked "1". Remove the white  label marked "2". Rub patch adhesive wings for 2 additional minutes.  While looking in a mirror, press and release button in center of patch. A small green light will  flash 3-4 times. This will be your only indicator that the monitor has been turned on.  Do not shower for the first 24 hours. You may shower after the first 24 hours.  Press the button if you feel a symptom. You will hear a small click. Record Date, Time and  Symptom in the Patient Logbook.  When you are ready to remove the patch, follow instructions on the last 2 pages of Patient  Logbook. Stick patch monitor onto the last page of Patient Logbook.  Place Patient Logbook in the blue and white box. Use locking tab on box and tape box closed  securely. The blue and white box has prepaid postage on it. Please place it in the mailbox as  soon as possible. Your physician should have your test results approximately 7 days after the  monitor has been [facility]  Call INuckollsat [phone] you have  questions regarding  your ZIO XT patch monitor. Call them immediately if you see an orange light blinking on your  monitor.  If your monitor falls off in less than 4 days, contact our Monitor department at [phone].  If your monitor becomes loose or falls off after 4 days call [employer] at [phone] for  suggestions on securing your monitor   Important Information About Sugar

## 2022-02-13 NOTE — Progress Notes (Signed)
Cardiology Office Note:    Date:  02/13/2022   ID:  Gerald Griffin, DOB 1950/10/13, MRN [id number]  PCP:  Lawerance Cruel, [place] [facility] Cardiologist:  Werner Lean, MD     Referring MD: Lawerance Cruel, MD   CC: slow heart rates Consulted for the evaluation bradycardia of  at the behest of Dr. Harrington Challenger  History of Present Illness:    Gerald Griffin is a 71 y.o. male with a hx of PVCs who presents for evaluation.  Patient notes that he is feeling OK.   He was going to give blood and was told he has low heart rates.  At the last time he we was giving blood  Was told that it was too slow to give blood and it was irregular. Has occasional irregular feeling in his chest.  PPI was given for this with some improvement. History of DOE with stairs and lightheadedness when standing but this hasn't happened recently.   Has had no chest pain, chest pressure, chest tightness, chest stinging . Walks 3 miles a day.  No shortness of breath, DOE .  No PND or orthopnea.  No weight gain, leg swelling , or abdominal swelling.  No syncope or near syncope .    Past Medical History:  Diagnosis Date   Anxiety    Bradycardia    ED (erectile dysfunction)    Hearing loss    High cholesterol    Hydrocele    Peyronie disease    SCCA (squamous cell carcinoma) of skin 04/02/2015   Left Preauricular (curet and 5FU)   Squamous cell carcinoma of skin 12/01/2005   Left Hand (in situ) (curet and 5FU)   Stress reaction     History reviewed. No pertinent surgical history.  Current Medications: Current Meds  Medication Sig   ALPRAZolam (XANAX) 0.25 MG tablet Take 0.25 mg by mouth at bedtime as needed for anxiety.   carvedilol (COREG) 3.125 MG tablet Take 1 tablet (3.125 mg total) by mouth 2 (two) times daily.   Lifitegrast (XIIDRA OP) Apply to eye.   Lifitegrast (XIIDRA) 5 % SOLN Apply to eye.   pantoprazole (PROTONIX) 40 MG tablet Take 40 mg by mouth daily.    pneumococcal 20-valent conjugate vaccine (PREVNAR 20) 0.5 ML injection Inject into the muscle.   sildenafil (VIAGRA) 100 MG tablet Take 100 mg by mouth daily as needed for erectile dysfunction.     Allergies:   Patient has no known allergies.   Social History   Socioeconomic History   Marital status: Married    Spouse name: Not on file   Number of children: Not on file   Years of education: Not on file   Highest education level: Not on file  Occupational History   Not on file  Tobacco Use   Smoking status: Never   Smokeless tobacco: Never  Substance and Sexual Activity   Alcohol use: Yes   Drug use: Never   Sexual activity: Not on file  Other Topics Concern   Not on file  Social History Narrative   Not on file   Social Determinants of Health   Financial Resource Strain: Not on file  Food Insecurity: Not on file  Transportation Needs: Not on file  Physical Activity: Not on file  Stress: Not on file  Social Connections: Not on file    Family History: Father with prior MI  ROS:   Please see the history of present illness.  All other systems reviewed and are negative.  EKGs/Labs/Other Studies Reviewed:    The following studies were reviewed today:  EKG:  EKG is  ordered today.  The ekg ordered today demonstrates  02/13/22:  Sinus bradycardia with frequent PVCs in the pattern of bigeminy  Recent Labs: No results found for requested labs within last 365 days.  Recent Lipid Panel No results found for: "CHOL", "TRIG", "HDL", "CHOLHDL", "VLDL", "LDLCALC", "LDLDIRECT"   Physical Exam:    VS:  BP (!) 162/64   Pulse (!) 52   Ht 6' (1.829 m)   Wt 195 lb 12.8 oz (88.8 kg)   SpO2 98%   BMI 26.56 kg/m     Wt Readings from Last 3 Encounters:  02/13/22 195 lb 12.8 oz (88.8 kg)    GEN:  Well nourished, well developed in no acute distress HEENT: Normal NECK: No JVD; No carotid bruits LYMPHATICS: No lymphadenopathy CARDIAC: IRIR, no murmurs, rubs,  gallops RESPIRATORY:  Clear to auscultation without rales, wheezing or rhonchi  ABDOMEN: Soft, non-tender, non-distended MUSCULOSKELETAL:  No edema; No deformity  SKIN: Warm and dry NEUROLOGIC:  Alert and oriented x 3 PSYCHIATRIC:  Normal affect   ASSESSMENT:    1. PVC's (premature ventricular contractions)   2. Chest discomfort    PLAN:    New symptomatic PVCs Elevated BP reading - 3 day non live ziopatch - coreg 3.25 mg PO BID after ziopatch - Echo  - NM Stress test   May need flecainide based on results 3- months with APP unless decrease EF       Medication Adjustments/Labs and Tests Ordered: Current medicines are reviewed at length with the patient today.  Concerns regarding medicines are outlined above.  Orders Placed This Encounter  Procedures   LONG TERM MONITOR (3-14 DAYS)   MYOCARDIAL PERFUSION IMAGING   EKG 12-Lead   ECHOCARDIOGRAM COMPLETE   Meds ordered this encounter  Medications   carvedilol (COREG) 3.125 MG tablet    Sig: Take 1 tablet (3.125 mg total) by mouth 2 (two) times daily.    Dispense:  180 tablet    Refill:  3    Patient Instructions  Medication Instructions:  Your physician has recommended you make the following change in your medication:  START: carvedilol (Coreg) 3.125 mg by mouth twice daily- -START AFTER YOU WEAR HEART MONITOR  *If you need a refill on your cardiac medications before your next appointment, please call your pharmacy*   Lab Work: NONE If you have labs (blood work) drawn today and your tests are completely normal, you will receive your results only by: [place] (if you have MyChart) OR A paper copy in the mail If you have any lab test that is abnormal or we need to change your treatment, we will call you to review the results.   Testing/Procedures: Your physician has requested that you have an echocardiogram. Echocardiography is a painless test that uses sound waves to create images of your heart. It  provides your doctor with information about the size and shape of your heart and how well your heart's chambers and valves are working. This procedure takes approximately one hour. There are no restrictions for this procedure. Please do NOT wear cologne, perfume, aftershave, or lotions (deodorant is allowed). Please arrive 15 minutes prior to your appointment time.  Your physician has requested that you have en exercise stress myoview. For further information please visit [email or web address]. Please follow instruction sheet, as given.   You are scheduled for  a Myocardial Perfusion Imaging Study. Please arrive 15 minutes prior to your appointment time for registration and insurance purposes.   The test will take approximately 3 to 4 hours to complete; you may bring reading material.  If someone comes with you to your appointment, they will need to remain in the main lobby due to limited space in the testing area.    How to prepare for your Myocardial Perfusion Test: Do not eat or drink 3 hours prior to your test, except you may have water. Do not consume products containing caffeine (regular or decaffeinated) 12 hours prior to your test. (ex: coffee, chocolate, sodas, tea). Do bring a list of your current medications with you.  If not listed below, you may take your medications as normal. DO NOT TAKE Carvedilol (Coreg) 24 hours prior to test  Do wear comfortable clothes (no dresses or overalls) and walking shoes, tennis shoes preferred (No heels or open toe shoes are allowed). Do NOT wear cologne, perfume, aftershave, or lotions (deodorant is allowed). If these instructions are not followed, your test will have to be rescheduled.  If you cannot keep your appointment, please provide 24 hours notification to the Nuclear Lab, to avoid a possible $50 charge to your account.      Your physician has requested that you wear a 3 day heart monitor.    Follow-Up: At [facility], you and  your health needs are our priority.  As part of our continuing mission to provide you with exceptional heart care, we have created designated Provider Care Teams.  These Care Teams include your primary Cardiologist (physician) and Advanced Practice Providers (APPs -  Physician Assistants and Nurse Practitioners) who all work together to provide you with the care you need, when you need it.  We recommend signing up for the patient portal called "MyChart".  Sign up information is provided on this After Visit Summary.  MyChart is used to connect with patients for Virtual Visits (Telemedicine).  Patients are able to view lab/test results, encounter notes, upcoming appointments, etc.  Non-urgent messages can be sent to your provider as well.   To learn more about what you can do with MyChart, go to [email or web address].    Your next appointment:   3-4 month(s)  The format for your next appointment:   In Person  Provider:   Werner Lean, MD     Other Instructions ZIO XT- Long Term Monitor Instructions  Your physician has requested you wear a ZIO patch monitor for 3 days.  This is a single patch monitor. [employer] supplies one patch monitor per enrollment. Additional stickers are not available. Please do not apply patch if you will be having a Nuclear Stress Test,  Echocardiogram, Cardiac CT, MRI, or Chest Xray during the period you would be wearing the  monitor. The patch cannot be worn during these tests. You cannot remove and re-apply the  ZIO XT patch monitor.  Your ZIO patch monitor will be mailed 3 day USPS to your address on file. It may take 3-5 days  to receive your monitor after you have been enrolled.  Once you have received your monitor, please review the enclosed instructions. Your monitor  has already been registered assigning a specific monitor serial # to you.  Billing and Patient Assistance Program Information  We have supplied [employer] with any of your insurance  information on file for billing purposes. [employer] offers a sliding scale Patient Assistance Program for patients that do not have  insurance, or whose insurance does not completely cover the cost of the ZIO monitor.  You must apply for the Patient Assistance Program to qualify for this discounted rate.  To apply, please call [employer] at [phone], select option 4, select option 2, ask to apply for  Patient Assistance Program. Theodore Demark will ask your household income, and how many people  are in your household. They will quote your out-of-pocket cost based on that information.  [employer] will also be able to set up a 20-month interest-free payment plan if needed.  Applying the monitor   Shave hair from upper left chest.  Hold abrader disc by orange tab. Rub abrader in 40 strokes over the upper left chest as  indicated in your monitor instructions.  Clean area with 4 enclosed alcohol pads. Let dry.  Apply patch as indicated in monitor instructions. Patch will be placed under collarbone on left  side of chest with arrow pointing upward.  Rub patch adhesive wings for 2 minutes. Remove white label marked "1". Remove the white  label marked "2". Rub patch adhesive wings for 2 additional minutes.  While looking in a mirror, press and release button in center of patch. A small green light will  flash 3-4 times. This will be your only indicator that the monitor has been turned on.  Do not shower for the first 24 hours. You may shower after the first 24 hours.  Press the button if you feel a symptom. You will hear a small click. Record Date, Time and  Symptom in the Patient Logbook.  When you are ready to remove the patch, follow instructions on the last 2 pages of Patient  Logbook. Stick patch monitor onto the last page of Patient Logbook.  Place Patient Logbook in the blue and white box. Use locking tab on box and tape box closed  securely. The blue and white box has prepaid postage on it. Please  place it in the mailbox as  soon as possible. Your physician should have your test results approximately 7 days after the  monitor has been [facility]  Call IGrabillat [phone] you have questions regarding  your ZIO XT patch monitor. Call them immediately if you see an orange light blinking on your  monitor.  If your monitor falls off in less than 4 days, contact our Monitor department at [phone]  If your monitor becomes loose or falls off after 4 days call [employer] at [phone]  suggestions on securing your monitor   Important Information About Sugar         Signed, MWerner Lean MD  02/13/2022 12:28 PM    [place]

## 2022-02-16 ENCOUNTER — Ambulatory Visit: Payer: Medicare HMO | Attending: Internal Medicine

## 2022-02-16 DIAGNOSIS — I493 Ventricular premature depolarization: Secondary | ICD-10-CM

## 2022-02-16 DIAGNOSIS — R0789 Other chest pain: Secondary | ICD-10-CM

## 2022-02-16 NOTE — Progress Notes (Unsigned)
Enrolled for [employer] to mail a ZIO XT long term holter monitor to the patients address on file.  

## 2022-02-18 DIAGNOSIS — R0789 Other chest pain: Secondary | ICD-10-CM | POA: Diagnosis not present

## 2022-02-18 DIAGNOSIS — I493 Ventricular premature depolarization: Secondary | ICD-10-CM

## 2022-02-26 ENCOUNTER — Telehealth (HOSPITAL_COMMUNITY): Payer: Self-pay

## 2022-02-26 NOTE — Telephone Encounter (Signed)
Detailed instructions left on the patient's answering machine. Asked to call back with any questions. S.Kaley Jutras EMTP 

## 2022-03-03 ENCOUNTER — Ambulatory Visit (HOSPITAL_BASED_OUTPATIENT_CLINIC_OR_DEPARTMENT_OTHER): Payer: Medicare HMO

## 2022-03-03 ENCOUNTER — Ambulatory Visit (HOSPITAL_COMMUNITY): Payer: Medicare HMO | Attending: Cardiology

## 2022-03-03 DIAGNOSIS — R0789 Other chest pain: Secondary | ICD-10-CM

## 2022-03-03 DIAGNOSIS — I493 Ventricular premature depolarization: Secondary | ICD-10-CM

## 2022-03-03 LAB — MYOCARDIAL PERFUSION IMAGING
Angina Index: 0
Duke Treadmill Score: 11
Estimated workload: 11.1
Exercise duration (min): 11 min
Exercise duration (sec): 0 s
LV dias vol: 64 mL (ref 62–150)
LV sys vol: 25 mL
MPHR: 147 {beats}/min
Nuc Stress EF: 60 %
Peak HR: 127 {beats}/min
Percent HR: 86 %
Rest HR: 52 {beats}/min
Rest Nuclear Isotope Dose: 10.5 mCi
SDS: 0
SRS: 0
SSS: 0
ST Depression (mm): 0 mm
Stress Nuclear Isotope Dose: 31.5 mCi
TID: 0.78

## 2022-03-03 LAB — ECHOCARDIOGRAM COMPLETE
Area-P 1/2: 3.51 cm2
Height: 72 in
S' Lateral: 2.8 cm
Weight: 3120 oz

## 2022-03-03 MED ORDER — TECHNETIUM TC 99M TETROFOSMIN IV KIT
31.5000 | PACK | Freq: Once | INTRAVENOUS | Status: AC | PRN
  Administered 2022-03-03: 31.5 via INTRAVENOUS

## 2022-03-03 MED ORDER — TECHNETIUM TC 99M TETROFOSMIN IV KIT
10.5000 | PACK | Freq: Once | INTRAVENOUS | Status: AC | PRN
  Administered 2022-03-03: 10.5 via INTRAVENOUS

## 2022-03-04 ENCOUNTER — Telehealth: Payer: Self-pay | Admitting: Internal Medicine

## 2022-03-04 DIAGNOSIS — R0789 Other chest pain: Secondary | ICD-10-CM | POA: Diagnosis not present

## 2022-03-04 DIAGNOSIS — I493 Ventricular premature depolarization: Secondary | ICD-10-CM | POA: Diagnosis not present

## 2022-03-04 NOTE — Telephone Encounter (Signed)
Patient returning call for echo results. 

## 2022-03-04 NOTE — Telephone Encounter (Signed)
Results reviewed with pt please see result notes.

## 2022-03-10 ENCOUNTER — Telehealth: Payer: Self-pay | Admitting: Internal Medicine

## 2022-03-10 NOTE — Telephone Encounter (Signed)
Patient returning call for monitor results. 

## 2022-03-11 ENCOUNTER — Other Ambulatory Visit (HOSPITAL_BASED_OUTPATIENT_CLINIC_OR_DEPARTMENT_OTHER): Payer: Self-pay

## 2022-03-11 MED ORDER — INFLUENZA VAC A&B SA ADJ QUAD 0.5 ML IM PRSY
PREFILLED_SYRINGE | INTRAMUSCULAR | 0 refills | Status: DC
  Filled 2022-03-11: qty 0.5, 1d supply, fill #0

## 2022-03-11 NOTE — Telephone Encounter (Signed)
Left a message to call back.

## 2022-03-17 NOTE — Telephone Encounter (Signed)
Called pt who reports does not have any needs from our office at this time.  Will call back if has questions or concerns.

## 2022-03-17 NOTE — Telephone Encounter (Signed)
Patient returning call.

## 2022-03-17 NOTE — Telephone Encounter (Signed)
Previously spoke with pt in regards to MD recommendation:  Flecainide start  - no evidence of cardiogenic shock, sick sinus syndrome or significant conduction delay, HOCM,  2nd/3rd degree heart block or bundle brand block without pacemaker, or  acquired/congenital QT prolongation, or  history of torsade de pointes  - on current AV nodal agent  - will start flecainide 100 mg PO BID  - will get EKG in one week  - will do treadmill stress test in one month  - if not improvement will then send to EP   Pt reports does not want to start new medication. Will continue to monitor symptoms and notify office if symptoms worsen.

## 2022-04-01 ENCOUNTER — Other Ambulatory Visit (HOSPITAL_BASED_OUTPATIENT_CLINIC_OR_DEPARTMENT_OTHER): Payer: Self-pay

## 2022-04-01 MED ORDER — COVID-19 MRNA VAC-TRIS(PFIZER) 30 MCG/0.3ML IM SUSY
0.3000 mL | PREFILLED_SYRINGE | Freq: Once | INTRAMUSCULAR | 0 refills | Status: AC
  Filled 2022-04-01: qty 0.3, 1d supply, fill #0

## 2022-04-06 ENCOUNTER — Encounter: Payer: Self-pay | Admitting: Internal Medicine

## 2022-04-06 DIAGNOSIS — H52203 Unspecified astigmatism, bilateral: Secondary | ICD-10-CM | POA: Diagnosis not present

## 2022-04-06 DIAGNOSIS — H2513 Age-related nuclear cataract, bilateral: Secondary | ICD-10-CM | POA: Diagnosis not present

## 2022-05-28 ENCOUNTER — Ambulatory Visit: Payer: Medicare HMO | Admitting: Internal Medicine

## 2022-06-08 ENCOUNTER — Ambulatory Visit: Payer: Medicare HMO | Attending: Internal Medicine | Admitting: Internal Medicine

## 2022-06-08 ENCOUNTER — Encounter: Payer: Self-pay | Admitting: Internal Medicine

## 2022-06-08 VITALS — BP 144/74 | HR 48 | Ht 72.0 in | Wt 198.0 lb

## 2022-06-08 DIAGNOSIS — E782 Mixed hyperlipidemia: Secondary | ICD-10-CM | POA: Diagnosis not present

## 2022-06-08 DIAGNOSIS — I10 Essential (primary) hypertension: Secondary | ICD-10-CM | POA: Diagnosis not present

## 2022-06-08 DIAGNOSIS — I493 Ventricular premature depolarization: Secondary | ICD-10-CM

## 2022-06-08 NOTE — Patient Instructions (Addendum)
Medication Instructions:  Your physician recommends that you continue on your current medications as directed. Please refer to the Current Medication list given to you today.  *If you need a refill on your cardiac medications before your next appointment, please call your pharmacy*   Lab Work: NONE If you have labs (blood work) drawn today and your tests are completely normal, you will receive your results only by: [place] (if you have MyChart) OR A paper copy in the mail If you have any lab test that is abnormal or we need to change your treatment, we will call you to review the results.   Testing/Procedures: Your physician has ordered you to have non-invasive CT for Calcium Scoring of your heart. It will calculate your risk of developing Coronary Artery Disease (CAD) by measuring the amount of buildup of calcium in the plaque in the coronary arteries (arteries surrounding your heart).    Please monitor your Blood Pressure call in results in 2 weeks.  I have provided you with information to assist you with correctly checking your Blood Pressure.    Follow-Up: At [facility], you and your health needs are our priority.  As part of our continuing mission to provide you with exceptional heart care, we have created designated Provider Care Teams.  These Care Teams include your primary Cardiologist (physician) and Advanced Practice Providers (APPs -  Physician Assistants and Nurse Practitioners) who all work together to provide you with the care you need, when you need it.  We recommend signing up for the patient portal called "MyChart".  Sign up information is provided on this After Visit Summary.  MyChart is used to connect with patients for Virtual Visits (Telemedicine).  Patients are able to view lab/test results, encounter notes, upcoming appointments, etc.  Non-urgent messages can be sent to your provider as well.   To learn more about what you can do with MyChart, go to  [email or web address].    Your next appointment:   1 year(s)  Provider:   Werner Lean, MD

## 2022-06-08 NOTE — Progress Notes (Signed)
Cardiology Office Note:    Date:  06/08/2022   ID:  Gerald Griffin, DOB 1950-10-10, MRN [id number]  PCP:  Lawerance Cruel, [place] Providers Cardiologist:  Werner Lean, MD     Referring MD: Lawerance Cruel, MD   CC: PVC follow up  History of Present Illness:    Gerald Griffin is a 72 y.o. male with a hx of PVCs who presents for evaluation. 2023: Found to have symptomatic PVCs.  Did not want to try AV nodal agents; no AAD. Negative stress test.  Patient notes that he is doing ok. Still has rare palpitations.  IT has improved on beta blockers..  When they occur it is minimal.   Still walks briskly with no symptoms. There are no interval hospital/ED visit.    No chest pain or pressure outside of his heart fluttering .  No SOB/DOE and no PND/Orthopnea.  No weight gain or leg swelling.  No palpitations or syncope.  Does not do ambulatory BP monitoring.  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  Current Medications: Current Meds  Medication Sig   ALPRAZolam (XANAX) 0.25 MG tablet Take 0.25 mg by mouth at bedtime as needed for anxiety.   carvedilol (COREG) 3.125 MG tablet Take 1 tablet (3.125 mg total) by mouth 2 (two) times daily.   Lifitegrast (XIIDRA) 5 % SOLN Apply to eye.     Allergies:   Patient has no known allergies.   Social History   Socioeconomic History   Marital status: Married    Spouse name: Not on file   Number of children: Not on file   Years of education: Not on file   Highest education level: Not on file  Occupational History   Not on file  Tobacco Use   Smoking  status: Never   Smokeless tobacco: Never  Substance and Sexual Activity   Alcohol use: Yes   Drug use: Never   Sexual activity: Not on file  Other Topics Concern   Not on file  Social History Narrative   Not on file   Social Determinants of Health   Financial Resource Strain: Not on file  Food Insecurity: Not on file  Transportation Needs: Not on file  Physical Activity: Not on file  Stress: Not on file  Social Connections: Not on file    Family History: Father with prior MI  ROS:   Please see the history of present illness.     All other systems reviewed and are negative.  EKGs/Labs/Other Studies Reviewed:    The following studies were reviewed today:  EKG:  EKG is  ordered today.  The ekg ordered today demonstrates  06/08/22: Sinus Bradycardia rate 48 02/13/22:  Sinus bradycardia with frequent PVCs in the pattern of bigeminy  Cardiac Studies & Procedures     STRESS TESTS  MYOCARDIAL PERFUSION IMAGING 03/03/2022  Narrative   The study is normal. The study is low risk.   No ST deviation was noted.   LV perfusion is  normal. There is no evidence of ischemia. There is no evidence of infarction.   Left ventricular function is normal. Nuclear stress EF: 60 %. The left ventricular ejection fraction is normal (55-65%). End diastolic cavity size is normal. End systolic cavity size is normal.   Prior study not available for comparison.  Normal stress nuclear study with no ischemia or infarction.  Gated ejection fraction 60% with normal wall motion.   ECHOCARDIOGRAM  ECHOCARDIOGRAM COMPLETE 03/03/2022  Narrative ECHOCARDIOGRAM REPORT    [name] Date of Exam: 03/03/2022 Medical Rec #:  [id number]     Height:       72.0 in Accession #:    [id number]    Weight:       195.0 lb Date of Birth:  1950/07/06     BSA:          2.108 m Patient Age:    21 years      BP:           162/64 mmHg Patient Gender: M             HR:           60 bpm. Exam Location:   [place]  Procedure: 2D Echo, 3D Echo, Cardiac Doppler and Color Doppler  Indications:    I49.3 PVC's R07.89 Chest Discomfort  History:        Patient has no prior history of Echocardiogram examinations. Arrythmias:Bradycardia; Risk Factors:Dyslipidemia and Family History of Coronary Artery Disease. Palpitations.  Sonographer:    Deliah Boston RDCS Referring Phys: [facility] A Fordyce Lepak  IMPRESSIONS   1. Left ventricular ejection fraction by 3D volume is 71 %. The left ventricle has normal function. The left ventricle has no regional wall motion abnormalities. Left ventricular diastolic parameters are consistent with Grade I diastolic dysfunction (impaired relaxation). 2. Right ventricular systolic function is normal. The right ventricular size is normal. 3. The mitral valve is normal in structure. No evidence of mitral valve regurgitation. No evidence of mitral stenosis. 4. The aortic valve is normal in structure. Aortic valve regurgitation is not visualized. No aortic stenosis is present. 5. The inferior vena cava is normal in size with greater than 50% respiratory variability, suggesting right atrial pressure of 3 mmHg.  FINDINGS Left Ventricle: Left ventricular ejection fraction by 3D volume is 71 %. The left ventricle has normal function. The left ventricle has no regional wall motion abnormalities. The left ventricular internal cavity size was normal in size. There is no left ventricular hypertrophy. Left ventricular diastolic parameters are consistent with Grade I diastolic dysfunction (impaired relaxation).  Right Ventricle: The right ventricular size is normal. No increase in right ventricular wall thickness. Right ventricular systolic function is normal.  Left Atrium: Left atrial size was normal in size.  Right Atrium: Right atrial size was normal in size.  Pericardium: There is no evidence of pericardial effusion.  Mitral Valve: The mitral valve is normal in  structure. No evidence of mitral valve regurgitation. No evidence of mitral valve stenosis.  Tricuspid Valve: The tricuspid valve is normal in structure. Tricuspid valve regurgitation is not demonstrated. No evidence of tricuspid stenosis.  Aortic Valve: The aortic valve is normal in structure. Aortic valve regurgitation is not visualized. No aortic stenosis is present.  Pulmonic Valve: The pulmonic valve was not well visualized. Pulmonic valve regurgitation is not visualized. No evidence of pulmonic stenosis.  Aorta: The aortic root is normal in size and structure.  Venous: The  inferior vena cava is normal in size with greater than 50% respiratory variability, suggesting right atrial pressure of 3 mmHg.  IAS/Shunts: No atrial level shunt detected by color flow Doppler.   LEFT VENTRICLE PLAX 2D LVIDd:         5.00 cm         Diastology LVIDs:         2.80 cm         LV e' medial:    6.96 cm/s LV PW:         0.90 cm         LV E/e' medial:  10.0 LV IVS:        0.90 cm         LV e' lateral:   8.27 cm/s LVOT diam:     2.20 cm         LV E/e' lateral: 8.4 LV SV:         90 LV SV Index:   43 LVOT Area:     3.80 cm        3D Volume EF LV 3D EF:    Left ventricul ar ejection fraction by 3D volume is 71 %.  3D Volume EF: 3D EF:        71 % LV EDV:       125 ml LV ESV:       36 ml LV SV:        89 ml  RIGHT VENTRICLE RV Basal diam:  3.50 cm RV S prime:     17.90 cm/s TAPSE (M-mode): 2.9 cm  LEFT ATRIUM             Index        RIGHT ATRIUM           Index LA diam:        4.35 cm 2.06 cm/m   RA Area:     20.30 cm LA Vol (A2C):   72.3 ml 34.30 ml/m  RA Volume:   61.30 ml  29.08 ml/m LA Vol (A4C):   34.6 ml 16.42 ml/m LA Biplane Vol: 53.3 ml 25.29 ml/m AORTIC VALVE LVOT Vmax:   111.00 cm/s LVOT Vmean:  71.300 cm/s LVOT VTI:    0.238 m  AORTA Ao Root diam: 3.30 cm Ao Asc diam:  3.20 cm  MITRAL VALVE MV Area (PHT)  cm         SHUNTS MV Decel Time: 216 msec     Systemic VTI:  0.24 m MV E velocity: 69.45 cm/s  Systemic Diam: 2.20 cm MV A velocity: 78.05 cm/s MV E/A ratio:  0.89  Kardie Tobb DO Electronically signed by Berniece Salines DO Signature Date/Time: 03/03/2022/12:09:57 PM    Final    MONITORS  LONG TERM MONITOR (3-14 DAYS) 03/08/2022  Narrative   Isolated PACs were rare (<1.0%).   Isolated PVCs were occasional (2.2%).   Triggered and diary events associated with sinus bradycardia and PVCs.   Average heart rate 53 bpm.  Occasional, rarely symptomatic PVCs.            Recent Labs: No results found for requested labs within last 365 days.  Recent Lipid Panel No results found for: "CHOL", "TRIG", "HDL", "CHOLHDL", "VLDL", "LDLCALC", "LDLDIRECT"   Physical Exam:    VS:  BP (!) 144/74   Pulse (!) 48   Ht 6' (1.829 m)   Wt 198 lb (89.8 kg)   SpO2 98%   BMI 26.85 kg/m  HYPERTENSION CONTROL Vitals:   06/08/22 0907 06/08/22 1031  BP: (!) 140/70 (!) 144/74    The patient's blood pressure is elevated above target today.  In order to address the patient's elevated BP: Blood pressure will be monitored at home to determine if medication changes need to be made. (Start AMB BP Monitoring)      GEN:  Well nourished, well developed in no acute distress HEENT: Normal NECK: No JVD CARDIAC: Regular bradycardia, no murmurs, rubs, gallops RESPIRATORY:  Clear to auscultation without rales, wheezing or rhonchi  ABDOMEN: Soft, non-tender, non-distended MUSCULOSKELETAL:  No edema; No deformity  SKIN: Warm and dry NEUROLOGIC:  Alert and oriented x 3 PSYCHIATRIC:  Normal affect   ASSESSMENT:    1. PVC's (premature ventricular contractions)   2. Essential hypertension   3. Mixed hyperlipidemia     PLAN:    New symptomatic PVCs Sinus bradycardia  - PVCs have  improved, was 2.2% burden - LVEF preserved, no triggering of atrial arrhythmias due to [place] conduction - minimally symptomatic - Coupling interval is greater than 400  ms favoring a benign etiology  - QRS duration is greater that 126 favoring a benign etiology  - monomorphic etiology - without family personal hx of SCD, Sarcoidosis of autoimmune disease - without PVC interpolation favoring a benign etiology - negative ischemic testing: - AV nodal agents at maximum tolerated dose - if worsening sx we have offered 1c agents and EP evaluation   HTN - above goal - offered ARB (Patient declined) will start AMB BP monitoring - gave education on how to perform ambulatory blood pressure monitoring including the frequency and technique; goal ambulatory blood pressure < 130/80 on average - discussed diet (DASH/low sodium), and exercise/weight loss interventions  - discussed stress and sleep - suspect he will need medication therapy  HLD - getting CAC score - discussed lifestyle as above  One year unless worsening sx me or APP  Time Spent Directly with Patient:   I have spent a total of 40 minutes with the patient reviewing notes, imaging, EKGs, labs and examining the patient as well as establishing an assessment and plan that was discussed personally with the patient.  > 50% of time was spent in direct patient care.        Medication Adjustments/Labs and Tests Ordered: Current medicines are reviewed at length with the patient today.  Concerns regarding medicines are outlined above.  Orders Placed This Encounter  Procedures   CT CARDIAC SCORING (SELF PAY ONLY)   EKG 12-Lead   No orders of the defined types were placed in this encounter.   Patient Instructions  Medication Instructions:  Your physician recommends that you continue on your current medications as directed. Please refer to the Current Medication list given to you today.  *If you need a refill on your cardiac medications before your next appointment, please call your pharmacy*   Lab Work: NONE If you have labs (blood work) drawn today and your tests are completely normal, you will  receive your results only by: [place] (if you have MyChart) OR A paper copy in the mail If you have any lab test that is abnormal or we need to change your treatment, we will call you to review the results.   Testing/Procedures: Your physician has ordered you to have non-invasive CT for Calcium Scoring of your heart. It will calculate your risk of developing Coronary Artery Disease (CAD) by measuring the amount of buildup of calcium in the plaque  in the coronary arteries (arteries surrounding your heart).    Please monitor your Blood Pressure call in results in 2 weeks.  I have provided you with information to assist you with correctly checking your Blood Pressure.    Follow-Up: At [facility], you and your health needs are our priority.  As part of our continuing mission to provide you with exceptional heart care, we have created designated Provider Care Teams.  These Care Teams include your primary Cardiologist (physician) and Advanced Practice Providers (APPs -  Physician Assistants and Nurse Practitioners) who all work together to provide you with the care you need, when you need it.  We recommend signing up for the patient portal called "MyChart".  Sign up information is provided on this After Visit Summary.  MyChart is used to connect with patients for Virtual Visits (Telemedicine).  Patients are able to view lab/test results, encounter notes, upcoming appointments, etc.  Non-urgent messages can be sent to your provider as well.   To learn more about what you can do with MyChart, go to [email or web address].    Your next appointment:   1 year(s)  Provider:   Werner Lean, MD       Signed, Werner Lean, MD  06/08/2022 10:39 AM    [place]

## 2022-06-09 ENCOUNTER — Telehealth: Payer: Self-pay

## 2022-06-09 NOTE — Telephone Encounter (Signed)
Inbound call from patient wanting to transfer care from digestive health Dr Earlean Shawl due to Dr retiring . Any provider.

## 2022-07-09 ENCOUNTER — Encounter (HOSPITAL_BASED_OUTPATIENT_CLINIC_OR_DEPARTMENT_OTHER): Payer: Self-pay

## 2022-07-09 ENCOUNTER — Ambulatory Visit (HOSPITAL_BASED_OUTPATIENT_CLINIC_OR_DEPARTMENT_OTHER)
Admission: RE | Admit: 2022-07-09 | Discharge: 2022-07-09 | Disposition: A | Discharge status: Home / Self Care | Payer: Medicare HMO | Source: Ambulatory Visit | Attending: Internal Medicine | Admitting: Internal Medicine

## 2022-07-09 DIAGNOSIS — I493 Ventricular premature depolarization: Secondary | ICD-10-CM | POA: Insufficient documentation

## 2022-07-13 ENCOUNTER — Telehealth: Payer: Self-pay

## 2022-07-13 DIAGNOSIS — E782 Mixed hyperlipidemia: Secondary | ICD-10-CM

## 2022-07-13 NOTE — Telephone Encounter (Signed)
-----   Message from Werner Lean, MD sent at 07/11/2022  2:43 PM EDT ----- Results: Elevated CAC score Plan: If still asymptomatic plan for aggressive heat attack prevention (stat with fasting lipids)  Werner Lean, MD

## 2022-07-13 NOTE — Telephone Encounter (Signed)
The patient has been notified of the result and verbalized understanding.  All questions (if any) were answered. Precious Gilding, RN 07/13/2022 4:16 PM   Pt reports still has occasional palpitations.  Occur intermittently; goes weeks without palpitations.  When has palpitations they last anywhere from 30 min to 1 hour.   Does not recall a pattern to palpitations such as decreased fluid intake or increased caffeine intake.   Pt will drop off BP readings on Thursday 07/16/22 when comes in for FLP.   Will send to MD to determine if increase in carvedilol is needed.

## 2022-07-16 ENCOUNTER — Ambulatory Visit: Payer: Medicare HMO | Attending: Internal Medicine

## 2022-07-16 DIAGNOSIS — E782 Mixed hyperlipidemia: Secondary | ICD-10-CM | POA: Diagnosis not present

## 2022-07-17 LAB — LIPID PANEL
Chol/HDL Ratio: 3 ratio (ref 0.0–5.0)
Cholesterol, Total: 237 mg/dL — ABNORMAL HIGH (ref 100–199)
HDL: 80 mg/dL (ref >39)
LDL Chol Calc (NIH): 141 mg/dL — ABNORMAL HIGH (ref 0–99)
Triglycerides: 93 mg/dL (ref 0–149)
VLDL Cholesterol Cal: 16 mg/dL (ref 5–40)

## 2022-07-20 ENCOUNTER — Telehealth: Payer: Self-pay

## 2022-07-20 DIAGNOSIS — E782 Mixed hyperlipidemia: Secondary | ICD-10-CM

## 2022-07-20 NOTE — Telephone Encounter (Signed)
-----   Message from Werner Lean, MD sent at 07/19/2022  2:07 PM EDT ----- Results: Elevated LDL with Elevated CAC score Plan: Rosuvastatin 5 mg and labs in three months  Werner Lean, MD

## 2022-07-20 NOTE — Telephone Encounter (Signed)
The patient has been notified of the result and verbalized understanding.  All questions (if any) were answered. [place], RN 07/20/2022 11:37 AM   Pt does not want to start medication at this time.  Will work on diet and exercise and come back for f/u labs on 12/01/22. Appointment reminder mailed out.   Pt reports palpitations have increased and would like to increase coreg.  Advised pt will need BP/HR log before medication can be adjusted.   Pt will call back with information.

## 2022-08-11 NOTE — Telephone Encounter (Signed)
Patient dropped off BP readings at front desk and it was placed in doctors box.

## 2022-08-12 NOTE — Telephone Encounter (Signed)
Pt called in reports has not had many palpitations since we last spoke.   Advised pt MD reviewed BP readings and does not want to make any changes.  Since pt is not having palpitations starting medication/ EP referral not needed at this time.  Advised pt if notices change to call back in for instructions.

## 2022-08-12 NOTE — Telephone Encounter (Signed)
MD reviewed BP readings that pt dropped off.  No changes needed at this.  MD does not recommend increasing carvedilol at this time.  HR does not meet threshold for increase.  Has the option to start flecainide or refer to EP.  Called pt left a message to call back.

## 2022-08-13 NOTE — Telephone Encounter (Signed)
BP readings addressed in 07/13/22 telephone encounter.

## 2022-09-15 NOTE — Telephone Encounter (Signed)
Awaiting patient records from Dr [facility] office.

## 2022-09-24 DIAGNOSIS — Z85828 Personal history of other malignant neoplasm of skin: Secondary | ICD-10-CM | POA: Diagnosis not present

## 2022-09-24 DIAGNOSIS — Z008 Encounter for other general examination: Secondary | ICD-10-CM | POA: Diagnosis not present

## 2022-09-24 DIAGNOSIS — I10 Essential (primary) hypertension: Secondary | ICD-10-CM | POA: Diagnosis not present

## 2022-09-24 DIAGNOSIS — Z87891 Personal history of nicotine dependence: Secondary | ICD-10-CM | POA: Diagnosis not present

## 2022-09-24 DIAGNOSIS — N529 Male erectile dysfunction, unspecified: Secondary | ICD-10-CM | POA: Diagnosis not present

## 2022-09-24 DIAGNOSIS — R001 Bradycardia, unspecified: Secondary | ICD-10-CM | POA: Diagnosis not present

## 2022-09-24 DIAGNOSIS — Z8249 Family history of ischemic heart disease and other diseases of the circulatory system: Secondary | ICD-10-CM | POA: Diagnosis not present

## 2022-09-24 DIAGNOSIS — H04129 Dry eye syndrome of unspecified lacrimal gland: Secondary | ICD-10-CM | POA: Diagnosis not present

## 2022-09-24 DIAGNOSIS — E785 Hyperlipidemia, unspecified: Secondary | ICD-10-CM | POA: Diagnosis not present

## 2022-10-20 DIAGNOSIS — E7800 Pure hypercholesterolemia, unspecified: Secondary | ICD-10-CM | POA: Diagnosis not present

## 2022-10-20 DIAGNOSIS — Z125 Encounter for screening for malignant neoplasm of prostate: Secondary | ICD-10-CM | POA: Diagnosis not present

## 2022-10-20 DIAGNOSIS — Z131 Encounter for screening for diabetes mellitus: Secondary | ICD-10-CM | POA: Diagnosis not present

## 2022-10-27 DIAGNOSIS — Z9181 History of falling: Secondary | ICD-10-CM | POA: Diagnosis not present

## 2022-10-27 DIAGNOSIS — Z6826 Body mass index (BMI) 26.0-26.9, adult: Secondary | ICD-10-CM | POA: Diagnosis not present

## 2022-10-27 DIAGNOSIS — D485 Neoplasm of uncertain behavior of skin: Secondary | ICD-10-CM | POA: Diagnosis not present

## 2022-10-27 DIAGNOSIS — E7800 Pure hypercholesterolemia, unspecified: Secondary | ICD-10-CM | POA: Diagnosis not present

## 2022-10-27 DIAGNOSIS — Z0000 Encounter for general adult medical examination without abnormal findings: Secondary | ICD-10-CM | POA: Diagnosis not present

## 2022-11-30 DIAGNOSIS — K5710 Diverticulosis of small intestine without perforation or abscess without bleeding: Secondary | ICD-10-CM | POA: Diagnosis not present

## 2022-11-30 DIAGNOSIS — Z1211 Encounter for screening for malignant neoplasm of colon: Secondary | ICD-10-CM | POA: Diagnosis not present

## 2022-11-30 DIAGNOSIS — K5730 Diverticulosis of large intestine without perforation or abscess without bleeding: Secondary | ICD-10-CM | POA: Diagnosis not present

## 2022-12-01 ENCOUNTER — Ambulatory Visit: Payer: Medicare HMO | Attending: Internal Medicine

## 2022-12-01 DIAGNOSIS — E782 Mixed hyperlipidemia: Secondary | ICD-10-CM

## 2022-12-01 LAB — LIPID PANEL
Chol/HDL Ratio: 3.4 ratio (ref 0.0–5.0)
Cholesterol, Total: 189 mg/dL (ref 100–199)
HDL: 55 mg/dL (ref >39)
LDL Chol Calc (NIH): 113 mg/dL — ABNORMAL HIGH (ref 0–99)
Triglycerides: 116 mg/dL (ref 0–149)
VLDL Cholesterol Cal: 21 mg/dL (ref 5–40)

## 2022-12-09 ENCOUNTER — Telehealth: Payer: Self-pay | Admitting: Internal Medicine

## 2022-12-09 DIAGNOSIS — E782 Mixed hyperlipidemia: Secondary | ICD-10-CM

## 2022-12-09 NOTE — Telephone Encounter (Signed)
-----   Message from Christell Constant sent at 12/04/2022  4:03 PM EDT ----- Results: LDL improved but still above goal Plan: Offer medication if patient is amenable   Christell Constant, MD

## 2022-12-09 NOTE — Telephone Encounter (Signed)
The patient has been notified of the result and verbalized understanding.  All questions (if any) were answered. Gerald Right Yania Bogie, RN 12/09/2022 2:40 PM    Pt is questioning starting cholesterol medication.  Reports PCP advised him that his LDL is high but HDL is also high and this balances out.  Advised pt I am not sure if this is true will send concern to MD to address.

## 2022-12-09 NOTE — Telephone Encounter (Signed)
Patient was returning call. Please advise ?

## 2022-12-10 MED ORDER — ROSUVASTATIN CALCIUM 5 MG PO TABS: 5.0000 mg | ORAL_TABLET | Freq: Every day | ORAL | 3 refills | Status: DC

## 2022-12-10 NOTE — Telephone Encounter (Signed)
Called pt advised of MD response.  Pt is agreeable to starting rosuvastatin 5 mg PO every day.  Will have f/u FLP, ALT on 03/02/23.   Appointment reminder placed in outgoing mail box.

## 2023-01-14 ENCOUNTER — Other Ambulatory Visit (HOSPITAL_BASED_OUTPATIENT_CLINIC_OR_DEPARTMENT_OTHER): Payer: Self-pay

## 2023-01-14 MED ORDER — COMIRNATY 30 MCG/0.3ML IM SUSY
0.3000 mL | PREFILLED_SYRINGE | Freq: Once | INTRAMUSCULAR | 0 refills | Status: AC
  Filled 2023-01-14: qty 0.3, 1d supply, fill #0

## 2023-01-21 DIAGNOSIS — L814 Other melanin hyperpigmentation: Secondary | ICD-10-CM | POA: Diagnosis not present

## 2023-01-21 DIAGNOSIS — C44329 Squamous cell carcinoma of skin of other parts of face: Secondary | ICD-10-CM | POA: Diagnosis not present

## 2023-01-21 DIAGNOSIS — L821 Other seborrheic keratosis: Secondary | ICD-10-CM | POA: Diagnosis not present

## 2023-01-21 DIAGNOSIS — L570 Actinic keratosis: Secondary | ICD-10-CM | POA: Diagnosis not present

## 2023-01-21 DIAGNOSIS — D485 Neoplasm of uncertain behavior of skin: Secondary | ICD-10-CM | POA: Diagnosis not present

## 2023-01-21 DIAGNOSIS — D225 Melanocytic nevi of trunk: Secondary | ICD-10-CM | POA: Diagnosis not present

## 2023-02-09 DIAGNOSIS — D0439 Carcinoma in situ of skin of other parts of face: Secondary | ICD-10-CM | POA: Diagnosis not present

## 2023-02-10 ENCOUNTER — Other Ambulatory Visit: Payer: Self-pay | Admitting: Internal Medicine

## 2023-02-14 ENCOUNTER — Other Ambulatory Visit: Payer: Self-pay | Admitting: Internal Medicine

## 2023-02-24 ENCOUNTER — Other Ambulatory Visit (HOSPITAL_BASED_OUTPATIENT_CLINIC_OR_DEPARTMENT_OTHER): Payer: Self-pay

## 2023-02-24 MED ORDER — INFLUENZA VAC A&B SURF ANT ADJ 0.5 ML IM SUSY
0.5000 mL | PREFILLED_SYRINGE | Freq: Once | INTRAMUSCULAR | 0 refills | Status: AC
  Filled 2023-02-24: qty 0.5, 1d supply, fill #0

## 2023-03-02 ENCOUNTER — Ambulatory Visit: Payer: Medicare HMO

## 2023-03-03 ENCOUNTER — Other Ambulatory Visit: Payer: Self-pay | Admitting: Internal Medicine

## 2023-03-03 ENCOUNTER — Ambulatory Visit: Payer: Medicare HMO | Attending: Internal Medicine

## 2023-03-03 DIAGNOSIS — E782 Mixed hyperlipidemia: Secondary | ICD-10-CM

## 2023-03-04 LAB — LIPID PANEL
Chol/HDL Ratio: 2.2 ratio (ref 0.0–5.0)
Cholesterol, Total: 170 mg/dL (ref 100–199)
HDL: 78 mg/dL (ref >39)
LDL Chol Calc (NIH): 76 mg/dL (ref 0–99)
Triglycerides: 91 mg/dL (ref 0–149)
VLDL Cholesterol Cal: 16 mg/dL (ref 5–40)

## 2023-03-04 LAB — ALT: ALT: 30 [IU]/L (ref 0–44)

## 2023-03-16 ENCOUNTER — Telehealth: Payer: Self-pay | Admitting: Internal Medicine

## 2023-03-16 DIAGNOSIS — E782 Mixed hyperlipidemia: Secondary | ICD-10-CM

## 2023-03-16 MED ORDER — ROSUVASTATIN CALCIUM 10 MG PO TABS: 10.0000 mg | ORAL_TABLET | Freq: Every day | ORAL | 3 refills | Status: DC

## 2023-03-16 NOTE — Telephone Encounter (Signed)
The patient has been notified of the result and verbalized understanding.  All questions (if any) were answered. Arvid Right Alfredo Collymore, RN 03/16/2023 10:04 AM   Advised to come in for FLP, ALT in Mid to late Feb at any [facility]. Orders placed and released for draw.

## 2023-03-16 NOTE — Telephone Encounter (Signed)
Pt returning nurses phone call regarding results. Please advise.

## 2023-03-16 NOTE — Telephone Encounter (Signed)
-----   Message from Christell Constant sent at 03/14/2023  8:20 PM EST ----- Results: LDL has markedly improved and is near goal Plan: Increase to 10 mg then labs in three months.  Christell Constant, MD

## 2023-04-12 DIAGNOSIS — H04123 Dry eye syndrome of bilateral lacrimal glands: Secondary | ICD-10-CM | POA: Diagnosis not present

## 2023-04-12 DIAGNOSIS — H2513 Age-related nuclear cataract, bilateral: Secondary | ICD-10-CM | POA: Diagnosis not present

## 2023-04-12 DIAGNOSIS — H52203 Unspecified astigmatism, bilateral: Secondary | ICD-10-CM | POA: Diagnosis not present

## 2023-05-10 ENCOUNTER — Other Ambulatory Visit: Payer: Self-pay | Admitting: Internal Medicine

## 2023-05-19 ENCOUNTER — Other Ambulatory Visit (HOSPITAL_BASED_OUTPATIENT_CLINIC_OR_DEPARTMENT_OTHER): Payer: Self-pay

## 2023-05-20 ENCOUNTER — Other Ambulatory Visit (HOSPITAL_BASED_OUTPATIENT_CLINIC_OR_DEPARTMENT_OTHER): Payer: Self-pay

## 2023-05-20 MED ORDER — SHINGRIX 50 MCG/0.5ML IM SUSR
0.5000 mL | Freq: Once | INTRAMUSCULAR | 0 refills | Status: AC
  Filled 2023-05-20: qty 0.5, 1d supply, fill #0

## 2023-07-08 DIAGNOSIS — E782 Mixed hyperlipidemia: Secondary | ICD-10-CM | POA: Diagnosis not present

## 2023-07-08 LAB — ALT: ALT: 30 IU/L (ref 0–44)

## 2023-07-08 LAB — LIPID PANEL
Chol/HDL Ratio: 2 ratio (ref 0.0–5.0)
Cholesterol, Total: 142 mg/dL (ref 100–199)
HDL: 72 mg/dL (ref >39)
LDL Chol Calc (NIH): 55 mg/dL (ref 0–99)
Triglycerides: 76 mg/dL (ref 0–149)
VLDL Cholesterol Cal: 15 mg/dL (ref 5–40)

## 2023-07-09 ENCOUNTER — Telehealth: Payer: Self-pay | Admitting: Internal Medicine

## 2023-07-09 NOTE — Telephone Encounter (Signed)
 Pt returning call he received in regards to results. Please advise

## 2023-07-09 NOTE — Telephone Encounter (Signed)
 Pt contacted and advised lab results. Pt would like to know if he can start giving blood again and wants to know how often he can donate?

## 2023-07-12 NOTE — Telephone Encounter (Signed)
 Message Received: Today Lamar Benes, RN  P Cv Div Ch St Triage Caller: Unspecified (3 days ago, 11:55 AM)      Previous Messages    ----- Message ----- From: Christell Constant, MD Sent: 07/11/2023   2:46 PM EDT To: Lamar Benes, RN  The [employer] recommends waiting 6 months after titration of cardiac medications before they will accept blood donation.  This would mean 6 months from the carvedilol start    Pt made aware of the above recommendations, per Dr. Izora Ribas.  Pt verbalized understanding and agrees with this plan.

## 2023-08-20 ENCOUNTER — Other Ambulatory Visit: Payer: Self-pay | Admitting: Internal Medicine

## 2023-08-23 ENCOUNTER — Other Ambulatory Visit: Payer: Self-pay

## 2023-08-23 MED ORDER — ROSUVASTATIN CALCIUM 10 MG PO TABS: 10.0000 mg | ORAL_TABLET | Freq: Every day | ORAL | 0 refills | Status: DC

## 2023-08-23 MED ORDER — CARVEDILOL 3.125 MG PO TABS: 3.1250 mg | ORAL_TABLET | Freq: Two times a day (BID) | ORAL | 0 refills | Status: DC

## 2023-08-26 ENCOUNTER — Other Ambulatory Visit: Payer: Self-pay | Admitting: Internal Medicine

## 2023-09-27 ENCOUNTER — Other Ambulatory Visit: Payer: Self-pay | Admitting: Internal Medicine

## 2023-09-29 ENCOUNTER — Other Ambulatory Visit: Payer: Self-pay | Admitting: Internal Medicine

## 2023-10-03 IMAGING — CR DG KNEE 3 VIEWS*R*
4 series · 4 of 4 positions shown · non-contrast
Comparison: None.

CLINICAL DATA: Right lateral knee pain related to a fall 3 months
ago when garage hit knee.

EXAM:
RIGHT KNEE - 3 VIEW

[w knee ap right (1 of 2)]
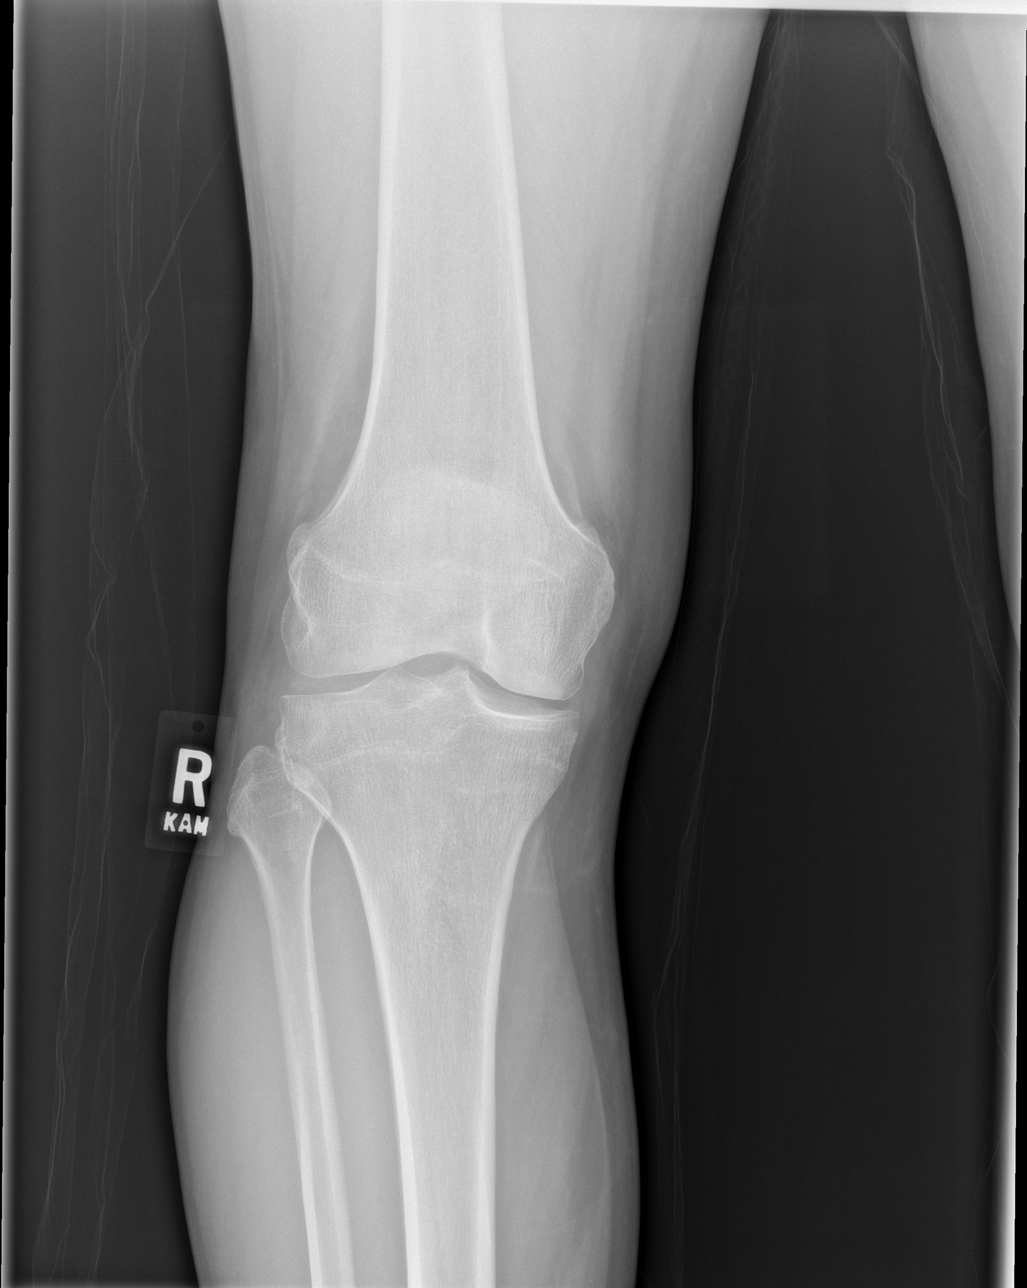

[w knee ap right (2 of 2)]
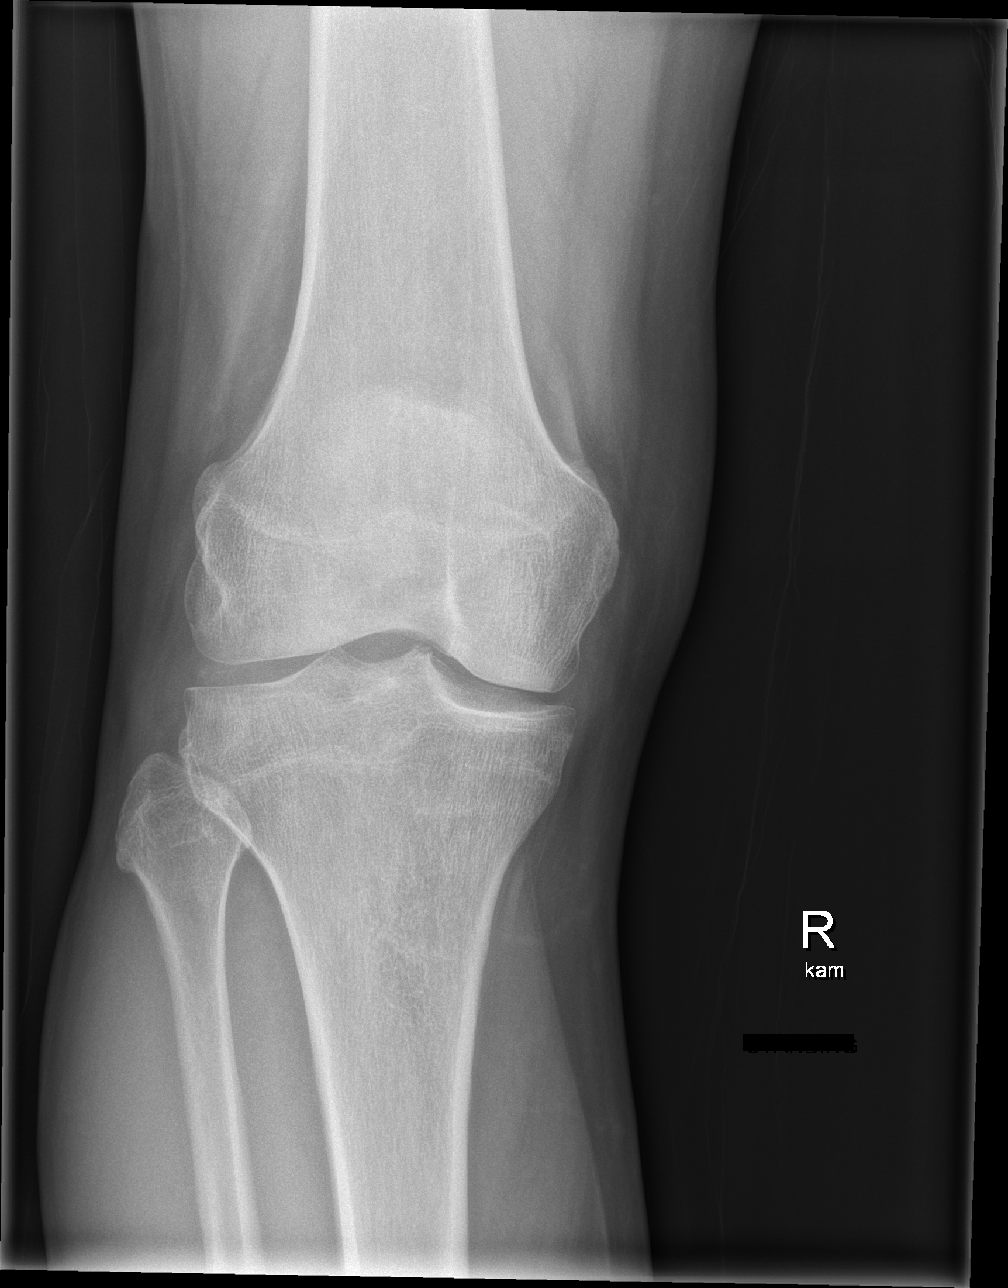

[w knee lat right]
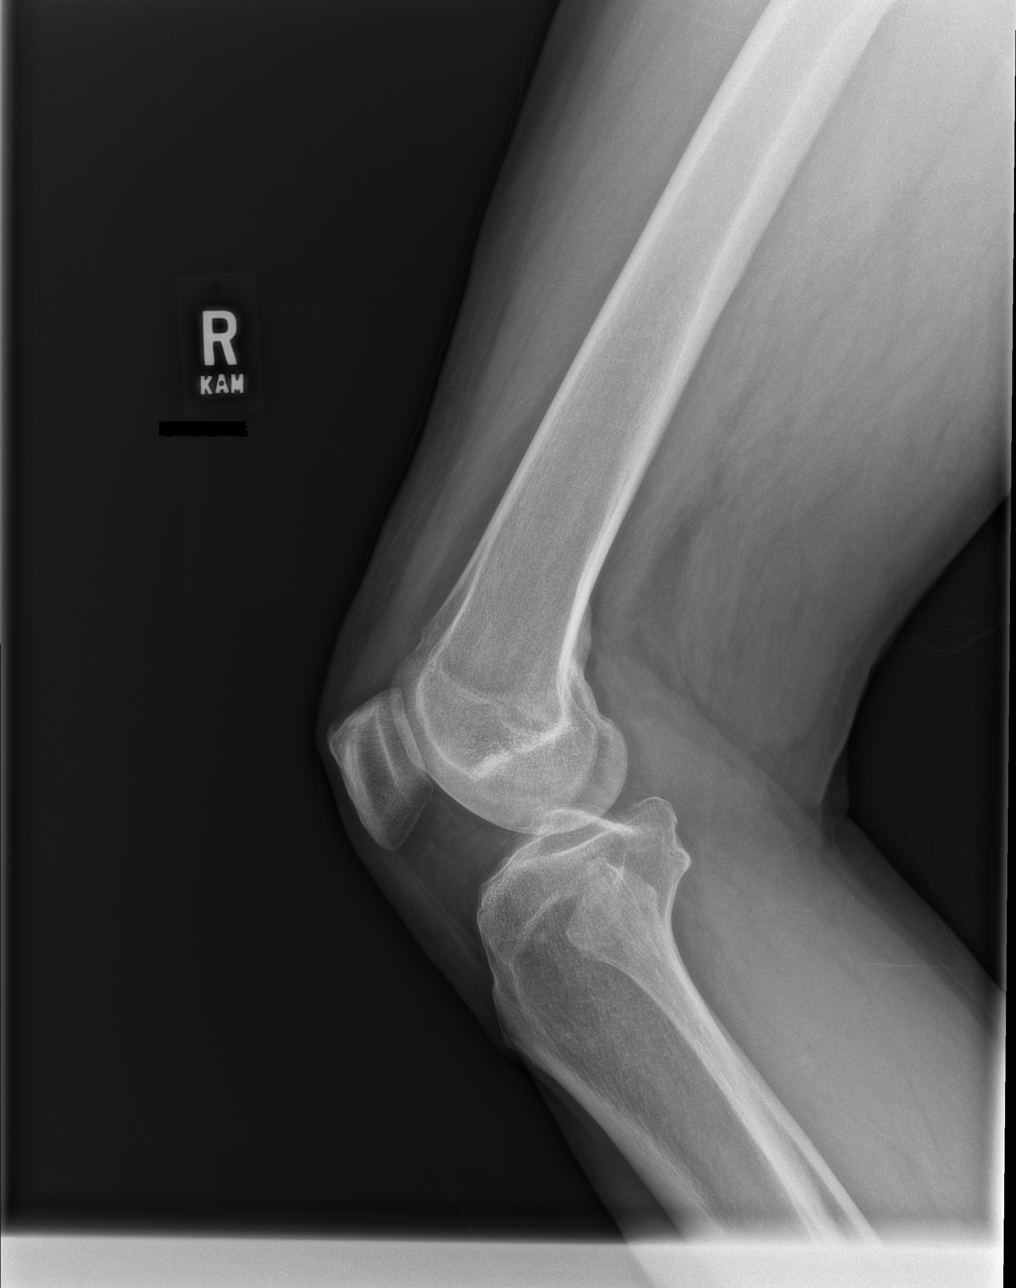

[x knee sunrise right]
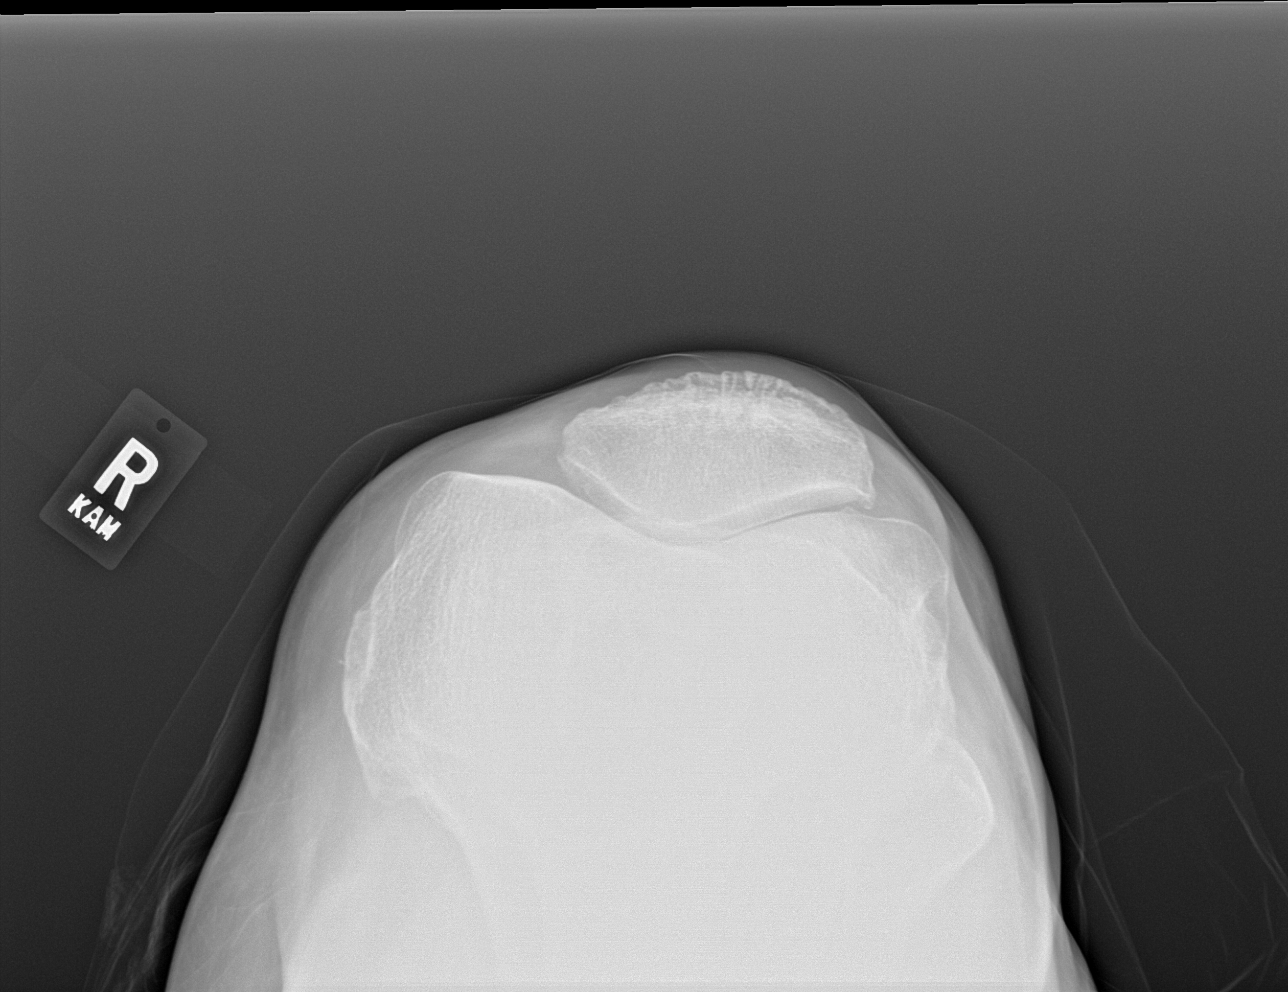

[4 of 4 positions shown; findings below may reference images not displayed]

FINDINGS: Frontal, oblique, lateral, and sunrise views of the right knee are
obtained. No fracture, subluxation, or dislocation. Mild 3
compartmental osteoarthritis, with chondrocalcinosis seen in the
lateral compartment. No joint effusion. Soft tissues are
unremarkable.
IMPRESSION: 1. Mild 3 compartmental osteoarthritis.  No acute bony abnormality.

## 2023-10-08 ENCOUNTER — Telehealth: Payer: Self-pay | Admitting: Internal Medicine

## 2023-10-08 MED ORDER — CARVEDILOL 3.125 MG PO TABS: 3.1250 mg | ORAL_TABLET | Freq: Two times a day (BID) | ORAL | 1 refills | Status: DC

## 2023-10-08 NOTE — Telephone Encounter (Signed)
 Pt's medication was sent to pt's pharmacy as requested. Confirmation received.

## 2023-10-08 NOTE — Telephone Encounter (Signed)
*  STAT* If patient is at the pharmacy, call can be transferred to refill team.   1. Which medications need to be refilled? (please list name of each medication and dose if known) carvedilol  (COREG ) 3.125 MG tablet    2. Would you like to learn more about the convenience, safety, & potential cost savings by using the [facility]?     3. Are you open to using the [facility] (Type [facility].  ).   4. Which pharmacy/location (including street and city if local pharmacy) is medication to be sent to?  [id number] - [place], [place] - [address]     5. Do they need a 30 day or 90 day supply? 30 day

## 2023-10-13 ENCOUNTER — Telehealth: Payer: Self-pay | Admitting: Internal Medicine

## 2023-10-13 MED ORDER — CARVEDILOL 3.125 MG PO TABS: 3.1250 mg | ORAL_TABLET | Freq: Two times a day (BID) | ORAL | 0 refills | Status: DC

## 2023-10-13 NOTE — Telephone Encounter (Signed)
 Refills has been sent to the pharmacy.

## 2023-10-13 NOTE — Telephone Encounter (Signed)
*  STAT* If patient is at the pharmacy, call can be transferred to refill team.   1. Which medications need to be refilled? (please list name of each medication and dose if known)   carvedilol  (COREG ) 3.125 MG tablet   2. Would you like to learn more about the convenience, safety, & potential cost savings by using the [facility]?   3. Are you open to using the [facility] ([facility].)  4. Which pharmacy/location (including street and city if local pharmacy) is medication to be sent to?  [facility] - [place], [place] - [address]   5. Do they need a 30 day or 90 day supply?   Patient state he only has a few tablets left.  Patient has appointment scheduled on 7/29 with Duffy Gianotti, PA.

## 2023-11-01 DIAGNOSIS — Z125 Encounter for screening for malignant neoplasm of prostate: Secondary | ICD-10-CM | POA: Diagnosis not present

## 2023-11-01 DIAGNOSIS — Z79899 Other long term (current) drug therapy: Secondary | ICD-10-CM | POA: Diagnosis not present

## 2023-11-09 DIAGNOSIS — M25562 Pain in left knee: Secondary | ICD-10-CM | POA: Diagnosis not present

## 2023-11-11 NOTE — Progress Notes (Signed)
 Cardiology Office Note:  .   Date:  11/23/2023  ID:  Gerald Griffin, DOB 07-21-1950, MRN [id number] PCP: Okey Carlin Redbird, MD  [facility] Providers Cardiologist:  Stanly DELENA Leavens, MD    History of Present Illness: .   Gerald Griffin is a 73 y.o. 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  Patient comes in for f/u. Gets lightheaded when getting out of a car or going up stairs for 6 weeks. Goes away quickly. Gets a flushed or tingling feeling in his upper thighs when going up stairs and he gets dizzy. Staying hydrated. He walks 1 hr every other day. Had lab at PCP 11/01/23. Denies chest pain, dyspnea, palpitations, edema.  ROS:    Studies Reviewed: SABRA    EKG Interpretation Date/Time:  Tuesday November 23 2023 11:45:37 EDT Ventricular Rate:  41 PR Interval:  178 QRS Duration:  88 QT Interval:  476 QTC Calculation: 392 R Axis:   46  Text Interpretation: Marked sinus bradycardia No previous ECGs available Confirmed by Parthenia Klinefelter [id number] on 11/23/2023 11:49:08 AM    Prior CV Studies:    CAC 06/2022 FINDINGS: Coronary arteries: Normal origins.   Coronary Calcium  Score:   Left main: 0   Left anterior descending artery: 325   Left circumflex artery: 23   Right coronary artery: 203   Total: 551   Percentile: 70   Pericardium: Normal.   Ascending Aorta: Normal caliber.   Descending aortic atherosclerosis.   Non-cardiac: See separate report from [facility].   IMPRESSION: 1. Coronary calcium  score of 551. This was 5 percentile for age-, race-, and sex-matched controls.   2.  Descending aortic atherosclerosis.    STRESS TESTS   MYOCARDIAL PERFUSION IMAGING 03/03/2022   Narrative   The study is normal. The study is low risk.   No ST deviation was noted.   LV perfusion is normal. There is no evidence of ischemia. There is no evidence of infarction.   Left ventricular function is normal. Nuclear stress EF: 60  %. The left ventricular ejection fraction is normal (55-65%). End diastolic cavity size is normal. End systolic cavity size is normal.   Prior study not available for comparison.   Normal stress nuclear study with no ischemia or infarction.  Gated ejection fraction 60% with normal wall motion.   ECHOCARDIOGRAM   ECHOCARDIOGRAM COMPLETE 03/03/2022   Narrative ECHOCARDIOGRAM REPORT       [name] Date of Exam: 03/03/2022 Medical Rec #:  [id number]     Height:       72.0 in Accession #:    [id number]    Weight:       195.0 lb Date of Birth:  11-03-1950     BSA:          2.108 m Patient Age:    71 years      BP:           162/64 mmHg Patient Gender: M             HR:           60 bpm. Exam Location:  [address]   Procedure: 2D Echo, 3D Echo, Cardiac Doppler and Color Doppler   Indications:    I49.3 PVC's R07.89 Chest Discomfort   History:        Patient has no prior history of Echocardiogram examinations. Arrythmias:Bradycardia; Risk Factors:Dyslipidemia and Family History of  Coronary Artery Disease. Palpitations.   Sonographer:    Heather Hawks RDCS Referring Phys: [facility] A CHANDRASEKHAR   IMPRESSIONS     1. Left ventricular ejection fraction by 3D volume is 71 %. The left ventricle has normal function. The left ventricle has no regional wall motion abnormalities. Left ventricular diastolic parameters are consistent with Grade I diastolic dysfunction (impaired relaxation). 2. Right ventricular systolic function is normal. The right ventricular size is normal. 3. The mitral valve is normal in structure. No evidence of mitral valve regurgitation. No evidence of mitral stenosis. 4. The aortic valve is normal in structure. Aortic valve regurgitation is not visualized. No aortic stenosis is present. 5. The inferior vena cava is normal in size with greater than 50% respiratory variability, suggesting right atrial pressure of 3 mmHg.   FINDINGS Left  Ventricle: Left ventricular ejection fraction by 3D volume is 71 %. The left ventricle has normal function. The left ventricle has no regional wall motion abnormalities. The left ventricular internal cavity size was normal in size. There is no left ventricular hypertrophy. Left ventricular diastolic parameters are consistent with Grade I diastolic dysfunction (impaired relaxation).   Right Ventricle: The right ventricular size is normal. No increase in right ventricular wall thickness. Right ventricular systolic function is normal.   Left Atrium: Left atrial size was normal in size.   Right Atrium: Right atrial size was normal in size.   Pericardium: There is no evidence of pericardial effusion.   Mitral Valve: The mitral valve is normal in structure. No evidence of mitral valve regurgitation. No evidence of mitral valve stenosis.   Tricuspid Valve: The tricuspid valve is normal in structure. Tricuspid valve regurgitation is not demonstrated. No evidence of tricuspid stenosis.   Aortic Valve: The aortic valve is normal in structure. Aortic valve regurgitation is not visualized. No aortic stenosis is present.   Pulmonic Valve: The pulmonic valve was not well visualized. Pulmonic valve regurgitation is not visualized. No evidence of pulmonic stenosis.   Aorta: The aortic root is normal in size and structure.   Venous: The inferior vena cava is normal in size with greater than 50% respiratory variability, suggesting right atrial pressure of 3 mmHg.   IAS/Shunts: No atrial level shunt detected by color flow Doppler.     LEFT VENTRICLE PLAX 2D LVIDd:         5.00 cm         Diastology LVIDs:         2.80 cm         LV e' medial:    6.96 cm/s LV PW:         0.90 cm         LV E/e' medial:  10.0 LV IVS:        0.90 cm         LV e' lateral:   8.27 cm/s LVOT diam:     2.20 cm         LV E/e' lateral: 8.4 LV SV:         90 LV SV Index:   43 LVOT Area:     3.80 cm        3D Volume EF LV 3D  EF:    Left ventricul ar ejection fraction by 3D volume is 71 %.   3D Volume EF: 3D EF:        71 % LV EDV:       125 ml LV ESV:  36 ml LV SV:        89 ml   RIGHT VENTRICLE RV Basal diam:  3.50 cm RV S prime:     17.90 cm/s TAPSE (M-mode): 2.9 cm   LEFT ATRIUM             Index        RIGHT ATRIUM           Index LA diam:        4.35 cm 2.06 cm/m   RA Area:     20.30 cm LA Vol (A2C):   72.3 ml 34.30 ml/m  RA Volume:   61.30 ml  29.08 ml/m LA Vol (A4C):   34.6 ml 16.42 ml/m LA Biplane Vol: 53.3 ml 25.29 ml/m AORTIC VALVE LVOT Vmax:   111.00 cm/s LVOT Vmean:  71.300 cm/s LVOT VTI:    0.238 m   AORTA Ao Root diam: 3.30 cm Ao Asc diam:  3.20 cm   MITRAL VALVE MV Area (PHT)  cm         SHUNTS MV Decel Time: 216 msec    Systemic VTI:  0.24 m MV E velocity: 69.45 cm/s  Systemic Diam: 2.20 cm MV A velocity: 78.05 cm/s MV E/A ratio:  0.89   Kardie Tobb DO Electronically signed by Dub Huntsman DO Signature Date/Time: 03/03/2022/12:09:57 PM       Final    MONITORS   LONG TERM MONITOR (3-14 DAYS) 03/08/2022   Narrative   Isolated PACs were rare (<1.0%).   Isolated PVCs were occasional (2.2%).   Triggered and diary events associated with sinus bradycardia and PVCs.   Average heart rate 53 bpm.   Occasional, rarely symptomatic PVCs.  Risk Assessment/Calculations:             Physical Exam:   VS:  BP 122/70   Pulse (!) 41   Ht 6' (1.829 m)   Wt 183 lb (83 kg)   BMI 24.82 kg/m    Orhtostatics: No data found. Wt Readings from Last 3 Encounters:  11/23/23 183 lb (83 kg)  06/08/22 198 lb (89.8 kg)  03/03/22 195 lb (88.5 kg)    GEN: Well nourished, well developed in no acute distress NECK: No JVD; No carotid bruits CARDIAC:  RRR, no murmurs, rubs, gallops RESPIRATORY:  Clear to auscultation without rales, wheezing or rhonchi  ABDOMEN: Soft, non-tender, non-distended EXTREMITIES:  No edema; No deformity   ASSESSMENT AND PLAN: .    PVC's   2.2% burden, normal LVEF, negative GXT  Sinus bradycardia-HR 41/m today and dizziness when going up stairs. On low dose coreg  3.125 mg bid for PVC's. Will place zio monitor, request recent labs by PCP. If TSH not done will need to do.  HTN-he says he's never had HTN but in his diagnosis. Stable on low dose coreg .  HLD-CAC score 551 and descending aortic atherosclerosis, LDL 55 06/2023 on rosuvastatin . Add ASA 81 mg daily        Dispo: f/u in 1 yr pending Zio  Signed, Olivia Pavy, PA-C

## 2023-11-23 ENCOUNTER — Other Ambulatory Visit: Payer: Self-pay | Admitting: Physician Assistant

## 2023-11-23 ENCOUNTER — Ambulatory Visit: Attending: Physician Assistant | Admitting: Physician Assistant

## 2023-11-23 ENCOUNTER — Ambulatory Visit

## 2023-11-23 VITALS — BP 122/70 | HR 41 | Ht 72.0 in | Wt 183.0 lb

## 2023-11-23 DIAGNOSIS — R001 Bradycardia, unspecified: Secondary | ICD-10-CM

## 2023-11-23 DIAGNOSIS — I493 Ventricular premature depolarization: Secondary | ICD-10-CM

## 2023-11-23 DIAGNOSIS — E782 Mixed hyperlipidemia: Secondary | ICD-10-CM | POA: Diagnosis not present

## 2023-11-23 DIAGNOSIS — I2510 Atherosclerotic heart disease of native coronary artery without angina pectoris: Secondary | ICD-10-CM | POA: Diagnosis not present

## 2023-11-23 DIAGNOSIS — R42 Dizziness and giddiness: Secondary | ICD-10-CM | POA: Diagnosis not present

## 2023-11-23 DIAGNOSIS — I10 Essential (primary) hypertension: Secondary | ICD-10-CM

## 2023-11-23 DIAGNOSIS — E7849 Other hyperlipidemia: Secondary | ICD-10-CM

## 2023-11-23 MED ORDER — ASPIRIN 81 MG PO TBEC: 81.0000 mg | DELAYED_RELEASE_TABLET | Freq: Every day | ORAL | Status: AC

## 2023-11-23 NOTE — Progress Notes (Unsigned)
 ZIO serial # [id number] from office inventory applied to patient.

## 2023-11-23 NOTE — Addendum Note (Signed)
 Addended by: MEMORY DELON POUR on: 11/23/2023 12:23 PM   Modules accepted: Orders

## 2023-11-23 NOTE — Patient Instructions (Signed)
 Medication Instructions:  Your physician has recommended you make the following change in your medication:   START Aspirin  81 mg taking  1 daily OVER THE COUNTER BABY ASPIRIN   *If you need a refill on your cardiac medications before your next appointment, please call your pharmacy*  Lab Work: None ordered  If you have labs (blood work) drawn today and your tests are completely normal, you will receive your results only by: MyChart Message (if you have MyChart) OR A paper copy in the mail If you have any lab test that is abnormal or we need to change your treatment, we will call you to review the results.  Testing/Procedures: Gerald Griffin- Long Term Monitor Instructions  Your physician has requested you wear a ZIO patch monitor for 14 days.  This is a single patch monitor. [employer] supplies one patch monitor per enrollment. Additional stickers are not available. Please do not apply patch if you will be having a Nuclear Stress Test,  Echocardiogram, Cardiac CT, MRI, or Chest Xray during the period you would be wearing the  monitor. The patch cannot be worn during these tests. You cannot remove and re-apply the  ZIO XT patch monitor.  Your ZIO patch monitor will be mailed 3 day USPS to your address on file. It may take 3-5 days  to receive your monitor after you have been enrolled.  Once you have received your monitor, please review the enclosed instructions. Your monitor  has already been registered assigning a specific monitor serial # to you.  Billing and Patient Assistance Program Information  We have supplied [employer] with any of your insurance information on file for billing purposes. [employer] offers a sliding scale Patient Assistance Program for patients that do not have  insurance, or whose insurance does not completely cover the cost of the ZIO monitor.  You must apply for the Patient Assistance Program to qualify for this discounted rate.  To apply, please call [employer] at  [phone], select option 4, select option 2, ask to apply for  Patient Assistance Program. Gerald Griffin will ask your household income, and how many people  are in your household. They will quote your out-of-pocket cost based on that information.  [employer] will also be able to set up a 34-month, interest-free payment plan if needed.  Applying the monitor   Shave hair from upper left chest.  Hold abrader disc by orange tab. Rub abrader in 40 strokes over the upper left chest as  indicated in your monitor instructions.  Clean area with 4 enclosed alcohol pads. Let dry.  Apply patch as indicated in monitor instructions. Patch will be placed under collarbone on left  side of chest with arrow pointing upward.  Rub patch adhesive wings for 2 minutes. Remove white label marked 1. Remove the white  label marked 2. Rub patch adhesive wings for 2 additional minutes.  While looking in a mirror, press and release button in center of patch. A small green light will  flash 3-4 times. This will be your only indicator that the monitor has been turned on.  Do not shower for the first 24 hours. You may shower after the first 24 hours.  Press the button if you feel a symptom. You will hear a small click. Record Date, Time and  Symptom in the Patient Logbook.  When you are ready to remove the patch, follow instructions on the last 2 pages of Patient  Logbook. Stick patch monitor onto the last page of Patient Logbook.  Place Patient Logbook in the blue and white box. Use locking tab on box and tape box closed  securely. The blue and white box has prepaid postage on it. Please place it in the mailbox as  soon as possible. Your physician should have your test results approximately 7 days after the  monitor has been mailed back to [facility].  Call [facility] Customer Care at [phone] if you have questions regarding  your ZIO XT patch monitor. Call them immediately if you see an orange light  blinking on your  monitor.  If your monitor falls off in less than 4 days, contact our Monitor department at [phone].  If your monitor becomes loose or falls off after 4 days call [employer] at [phone] for  suggestions on securing your monitor   Follow-Up: At [facility], you and your health needs are our priority.  As part of our continuing mission to provide you with exceptional heart care, our providers are all part of one team.  This team includes your primary Cardiologist (physician) and Advanced Practice Providers or APPs (Physician Assistants and Nurse Practitioners) who all work together to provide you with the care you need, when you need it.  Your next appointment:   1 year(s)  Provider:   Stanly DELENA Leavens, MD    We recommend signing up for the patient portal called MyChart.  Sign up information is provided on this After Visit Summary.  MyChart is used to connect with patients for Virtual Visits (Telemedicine).  Patients are able to view lab/test results, encounter notes, upcoming appointments, etc.  Non-urgent messages can be sent to your provider as well.   To learn more about what you can do with MyChart, go to [email or web address].   Other Instructions

## 2023-11-24 LAB — TSH: TSH: 1.15 u[IU]/mL (ref 0.450–4.500)

## 2023-11-25 ENCOUNTER — Ambulatory Visit: Payer: Self-pay | Admitting: Physician Assistant

## 2023-11-26 NOTE — Telephone Encounter (Signed)
Letter of results sent to pt  

## 2023-12-01 DIAGNOSIS — M25562 Pain in left knee: Secondary | ICD-10-CM | POA: Diagnosis not present

## 2023-12-07 DIAGNOSIS — M25562 Pain in left knee: Secondary | ICD-10-CM | POA: Diagnosis not present

## 2023-12-15 DIAGNOSIS — M25562 Pain in left knee: Secondary | ICD-10-CM | POA: Diagnosis not present

## 2023-12-20 DIAGNOSIS — R42 Dizziness and giddiness: Secondary | ICD-10-CM | POA: Diagnosis not present

## 2023-12-21 ENCOUNTER — Ambulatory Visit: Payer: Self-pay | Admitting: Physician Assistant

## 2023-12-21 DIAGNOSIS — R001 Bradycardia, unspecified: Secondary | ICD-10-CM

## 2023-12-21 DIAGNOSIS — I493 Ventricular premature depolarization: Secondary | ICD-10-CM

## 2023-12-21 DIAGNOSIS — R42 Dizziness and giddiness: Secondary | ICD-10-CM

## 2023-12-21 DIAGNOSIS — M25562 Pain in left knee: Secondary | ICD-10-CM | POA: Diagnosis not present

## 2023-12-21 DIAGNOSIS — I2510 Atherosclerotic heart disease of native coronary artery without angina pectoris: Secondary | ICD-10-CM

## 2023-12-21 NOTE — Telephone Encounter (Signed)
 Patient wants a call back to discuss symptoms he recorded in his heart monitor booklet - lightheadedness, hot flash feeling in legs and advice on next steps.

## 2023-12-22 DIAGNOSIS — M25562 Pain in left knee: Secondary | ICD-10-CM | POA: Diagnosis not present

## 2023-12-31 DIAGNOSIS — M25562 Pain in left knee: Secondary | ICD-10-CM | POA: Diagnosis not present

## 2024-01-06 ENCOUNTER — Other Ambulatory Visit: Payer: Self-pay | Admitting: Internal Medicine

## 2024-01-11 ENCOUNTER — Other Ambulatory Visit (HOSPITAL_BASED_OUTPATIENT_CLINIC_OR_DEPARTMENT_OTHER): Payer: Self-pay

## 2024-01-11 MED ORDER — FLUZONE HIGH-DOSE 0.5 ML IM SUSY
0.5000 mL | PREFILLED_SYRINGE | Freq: Once | INTRAMUSCULAR | 0 refills | Status: AC
  Filled 2024-01-11: qty 0.5, 1d supply, fill #0

## 2024-01-25 DIAGNOSIS — M25562 Pain in left knee: Secondary | ICD-10-CM | POA: Diagnosis not present

## 2024-01-25 DIAGNOSIS — Z08 Encounter for follow-up examination after completed treatment for malignant neoplasm: Secondary | ICD-10-CM | POA: Diagnosis not present

## 2024-01-25 DIAGNOSIS — L814 Other melanin hyperpigmentation: Secondary | ICD-10-CM | POA: Diagnosis not present

## 2024-01-25 DIAGNOSIS — Z85828 Personal history of other malignant neoplasm of skin: Secondary | ICD-10-CM | POA: Diagnosis not present

## 2024-01-25 DIAGNOSIS — D692 Other nonthrombocytopenic purpura: Secondary | ICD-10-CM | POA: Diagnosis not present

## 2024-01-25 DIAGNOSIS — L821 Other seborrheic keratosis: Secondary | ICD-10-CM | POA: Diagnosis not present

## 2024-01-28 ENCOUNTER — Ambulatory Visit: Attending: Internal Medicine | Admitting: Internal Medicine

## 2024-01-28 ENCOUNTER — Encounter: Payer: Self-pay | Admitting: Internal Medicine

## 2024-01-28 VITALS — BP 130/80 | HR 46 | Ht 72.0 in | Wt 187.0 lb

## 2024-01-28 DIAGNOSIS — I10 Essential (primary) hypertension: Secondary | ICD-10-CM

## 2024-01-28 DIAGNOSIS — I493 Ventricular premature depolarization: Secondary | ICD-10-CM

## 2024-01-28 DIAGNOSIS — E782 Mixed hyperlipidemia: Secondary | ICD-10-CM | POA: Diagnosis not present

## 2024-01-28 NOTE — Progress Notes (Signed)
 Cardiology Office Note:    Date:  01/28/2024   ID:  Gerald Griffin, DOB May 28, 1950, MRN [id number]  PCP:  Okey Carlin Redbird, MD   [facility] Providers Cardiologist:  Stanly DELENA Leavens, MD     Referring MD: Okey Carlin Redbird, MD   CC: PVC follow up  History of Present Illness:    Gerald Griffin is a 73 y.o. male with a hx of PVCs who presents for evaluation. 2023: Found to have symptomatic PVCs.  Did not want to try AV nodal agents; no AAD. Negative stress test. 2024: Sx improved with beta-blockades  Gerald Griffin is a 73 year old male with hypertension and coronary artery disease who presents for follow-up of premature ventricular contractions and elevated blood pressure.  He experiences lightheadedness, particularly when transitioning from a seated to a standing position, which has significantly improved, now occurring about 98% less often. He also experienced hot flashes in his thighs, which have decreased by 95%. No chest pain, syncope, or presyncope. Occasional palpitations are described as minimal.  He is on carvedilol  for the management of premature ventricular contractions (PVCs). He generally feels very good. He does not report fatigue or lack of energy.  His coronary artery calcium  score was discussed at the last visit. He has a history of plaque buildup in several coronary arteries as seen on prior imaging. No symptoms of chest discomfort, shortness of breath, or dyspnea on exertion. He maintains an active lifestyle, primarily walking for exercise, and reports never feeling tired.  His blood pressure was elevated at the start of the visit but returned to normal upon recheck. He is not aware of any consistently elevated blood pressure readings outside of this visit.  He does not smoke and has no other lifestyle factors that require change.  Past Medical History:  Diagnosis Date   Anxiety    Bradycardia    ED (erectile dysfunction)    Hearing loss     High cholesterol    Hydrocele    Peyronie disease    SCCA (squamous cell carcinoma) of skin 04/02/2015   Left Preauricular (curet and 5FU)   Squamous cell carcinoma of skin 12/01/2005   Left Hand (in situ) (curet and 5FU)   Stress reaction     No past surgical history on file.  Current Medications: Current Meds  Medication Sig   ALPRAZolam (XANAX) 0.25 MG tablet Take 0.25 mg by mouth at bedtime as needed for anxiety.   aspirin  EC 81 MG tablet Take 1 tablet (81 mg total) by mouth daily. Swallow whole.   carvedilol  (COREG ) 3.125 MG tablet TAKE 1 TABLET BY MOUTH TWICE DAILY . APPOINTMENT REQUIRED FOR FUTURE REFILLS   Lifitegrast (XIIDRA) 5 % SOLN Apply to eye.   rosuvastatin  (CRESTOR ) 10 MG tablet Take 1 tablet (10 mg total) by mouth daily.     Allergies:   Patient has no known allergies.   Social History   Socioeconomic History   Marital status: Married    Spouse name: Not on file   Number of children: Not on file   Years of education: Not on file   Highest education level: Not on file  Occupational History   Not on file  Tobacco Use   Smoking status: Never   Smokeless tobacco: Never  Substance and Sexual Activity   Alcohol use: Yes   Drug use: Never   Sexual activity: Not on file  Other Topics Concern   Not on file  Social History  Narrative   Not on file   Social Drivers of Health   Financial Resource Strain: Not on file  Food Insecurity: Not on file  Transportation Needs: Not on file  Physical Activity: Not on file  Stress: Not on file  Social Connections: Not on file    Family History: Father with prior MI  ROS:   Please see the history of present illness.     Labs/Other Studies Reviewed:      Cardiac Studies & Procedures   ______________________________________________________________________________________________   STRESS TESTS  MYOCARDIAL PERFUSION IMAGING 03/03/2022  Interpretation Summary   The study is normal. The study is low risk.    No ST deviation was noted.   LV perfusion is normal. There is no evidence of ischemia. There is no evidence of infarction.   Left ventricular function is normal. Nuclear stress EF: 60 %. The left ventricular ejection fraction is normal (55-65%). End diastolic cavity size is normal. End systolic cavity size is normal.   Prior study not available for comparison.  Normal stress nuclear study with no ischemia or infarction.  Gated ejection fraction 60% with normal wall motion.   ECHOCARDIOGRAM  ECHOCARDIOGRAM COMPLETE 03/03/2022  Narrative ECHOCARDIOGRAM REPORT    [name] Date of Exam: 03/03/2022 Medical Rec #:  [id number]     Height:       72.0 in Accession #:    [id number]    Weight:       195.0 lb Date of Birth:  [birth year]     BSA:          2.108 m Patient Age:    71 years      BP:           162/64 mmHg Patient Gender: M             HR:           60 bpm. Exam Location:  [address]  Procedure: 2D Echo, 3D Echo, Cardiac Doppler and Color Doppler  Indications:    I49.3 PVC's R07.89 Chest Discomfort  History:        Patient has no prior history of Echocardiogram examinations. Arrythmias:Bradycardia; Risk Factors:Dyslipidemia and Family History of Coronary Artery Disease. Palpitations.  Sonographer:    Heather Hawks RDCS Referring Phys: [facility] Mathew  IMPRESSIONS   1. Left ventricular ejection fraction by 3D volume is 71 %. The left ventricle has normal function. The left ventricle has no regional wall motion abnormalities. Left ventricular diastolic parameters are consistent with Grade I diastolic dysfunction (impaired relaxation). 2. Right ventricular systolic function is normal. The right ventricular size is normal. 3. The mitral valve is normal in structure. No evidence of mitral valve regurgitation. No evidence of mitral stenosis. 4. The aortic valve is normal in structure. Aortic valve regurgitation is not visualized. No aortic stenosis is  present. 5. The inferior vena cava is normal in size with greater than 50% respiratory variability, suggesting right atrial pressure of 3 mmHg.  FINDINGS Left Ventricle: Left ventricular ejection fraction by 3D volume is 71 %. The left ventricle has normal function. The left ventricle has no regional wall motion abnormalities. The left ventricular internal cavity size was normal in size. There is no left ventricular hypertrophy. Left ventricular diastolic parameters are consistent with Grade I diastolic dysfunction (impaired relaxation).  Right Ventricle: The right ventricular size is normal. No increase in right ventricular wall thickness. Right ventricular systolic function is normal.  Left Atrium: Left atrial  size was normal in size.  Right Atrium: Right atrial size was normal in size.  Pericardium: There is no evidence of pericardial effusion.  Mitral Valve: The mitral valve is normal in structure. No evidence of mitral valve regurgitation. No evidence of mitral valve stenosis.  Tricuspid Valve: The tricuspid valve is normal in structure. Tricuspid valve regurgitation is not demonstrated. No evidence of tricuspid stenosis.  Aortic Valve: The aortic valve is normal in structure. Aortic valve regurgitation is not visualized. No aortic stenosis is present.  Pulmonic Valve: The pulmonic valve was not well visualized. Pulmonic valve regurgitation is not visualized. No evidence of pulmonic stenosis.  Aorta: The aortic root is normal in size and structure.  Venous: The inferior vena cava is normal in size with greater than 50% respiratory variability, suggesting right atrial pressure of 3 mmHg.  IAS/Shunts: No atrial level shunt detected by color flow Doppler.   LEFT VENTRICLE PLAX 2D LVIDd:         5.00 cm         Diastology LVIDs:         2.80 cm         LV e' medial:    6.96 cm/s LV PW:         0.90 cm         LV E/e' medial:  10.0 LV IVS:        0.90 cm         LV e' lateral:    8.27 cm/s LVOT diam:     2.20 cm         LV E/e' lateral: 8.4 LV SV:         90 LV SV Index:   43 LVOT Area:     3.80 cm        3D Volume EF LV 3D EF:    Left ventricul ar ejection fraction by 3D volume is 71 %.  3D Volume EF: 3D EF:        71 % LV EDV:       125 ml LV ESV:       36 ml LV SV:        89 ml  RIGHT VENTRICLE RV Basal diam:  3.50 cm RV S prime:     17.90 cm/s TAPSE (M-mode): 2.9 cm  LEFT ATRIUM             Index        RIGHT ATRIUM           Index LA diam:        4.35 cm 2.06 cm/m   RA Area:     20.30 cm LA Vol (A2C):   72.3 ml 34.30 ml/m  RA Volume:   61.30 ml  29.08 ml/m LA Vol (A4C):   34.6 ml 16.42 ml/m LA Biplane Vol: 53.3 ml 25.29 ml/m AORTIC VALVE LVOT Vmax:   111.00 cm/s LVOT Vmean:  71.300 cm/s LVOT VTI:    0.238 m  AORTA Ao Root diam: 3.30 cm Ao Asc diam:  3.20 cm  MITRAL VALVE MV Area (PHT)  cm         SHUNTS MV Decel Time: 216 msec    Systemic VTI:  0.24 m MV E velocity: 69.45 cm/s  Systemic Diam: 2.20 cm MV A velocity: 78.05 cm/s MV E/A ratio:  0.89  Kardie Tobb DO Electronically signed by Dub Huntsman DO Signature Date/Time: 03/03/2022/12:09:57 PM    Final    MONITORS  LONG  TERM MONITOR (3-14 DAYS) 12/20/2023  Narrative   Patient had a minimum heart rate of 36 bpm, maximum heart rate of 115 bpm, and average heart rate of 50 bpm. Predominant underlying rhythm was sinus rhythm. One short run of SVT, only 5 beats. Isolated PACs were rare (<1.0%). Isolated PVCs were rare (<1.0%). Triggered and diary events associated with sinus rhythm and sinus bradycardia.  No malignant arrhythmias.   CT SCANS  CT CARDIAC SCORING (SELF PAY ONLY) 07/09/2022  Addendum 07/12/2022  9:36 PM ADDENDUM REPORT: 07/12/2022 21:33  EXAM: OVER-READ INTERPRETATION  CT CHEST  The following report is an over-read performed by radiologist Dr. Fonda [facility] on 07/12/2022. This over-read does not include  interpretation of cardiac or coronary anatomy or pathology. The coronary calcium  score interpretation by the cardiologist is attached.  COMPARISON:  None.  FINDINGS: Cardiovascular: No significant cardiovascular abnormalities identified without contrast. Coronal atheromatous calcifications are noted.  Mediastinum/Nodes: No enlarged mediastinal, hilar, or axillary lymph nodes. Thyroid  gland, trachea, and esophagus demonstrate no significant findings.  Lungs/Pleura: Imaged lungs are clear. No pleural effusion or pneumothorax.  Upper Abdomen: No acute abnormality.  Musculoskeletal: Minimal bilateral gynecomastia noted. No acute or significant osseous findings.  IMPRESSION: 1. Minimal bilateral gynecomastia. 2. Coronary atheromatous changes. 3. Otherwise no significant extracardiac incidental findings identified.   Electronically Signed By: Fonda Field M.D. On: 07/12/2022 21:33  Narrative CLINICAL DATA:  Cardiovascular Disease Risk stratification  EXAM: Coronary Calcium  Score  TECHNIQUE: A gated, non-contrast computed tomography scan of the heart was performed using 3mm slice thickness. Axial images were analyzed on a dedicated workstation. Calcium  scoring of the coronary arteries was performed using the Agatston method.  FINDINGS: Coronary arteries: Normal origins.  Coronary Calcium  Score:  Left main: 0  Left anterior descending artery: 325  Left circumflex artery: 23  Right coronary artery: 203  Total: 551  Percentile: 70  Pericardium: Normal.  Ascending Aorta: Normal caliber.  Descending aortic atherosclerosis.  Non-cardiac: See separate report from [facility].  IMPRESSION: 1. Coronary calcium  score of 551. This was 54 percentile for age-, race-, and sex-matched controls.  2.  Descending aortic atherosclerosis.  RECOMMENDATIONS: Coronary artery calcium  (CAC) score is a strong predictor of incident coronary heart disease  (CHD) and provides predictive information beyond traditional risk factors. CAC scoring is reasonable to use in the decision to withhold, postpone, or initiate statin therapy in intermediate-risk or selected borderline-risk asymptomatic adults (age 35-75 years and LDL-C >=70 to <190 mg/dL) who do not have diabetes or established atherosclerotic cardiovascular disease (ASCVD).* In intermediate-risk (10-year ASCVD risk >=7.5% to <20%) adults or selected borderline-risk (10-year ASCVD risk >=5% to <7.5%) adults in whom a CAC score is measured for the purpose of making a treatment decision the following recommendations have been made:  If CAC=0, it is reasonable to withhold statin therapy and reassess in 5 to 10 years, as long as higher risk conditions are absent (diabetes mellitus, family history of premature CHD in first degree relatives (males <55 years; females <65 years), cigarette smoking, or LDL >=190 mg/dL).  If CAC is 1 to 99, it is reasonable to initiate statin therapy for patients >=100 years of age.  If CAC is >=100 or >=75th percentile, it is reasonable to initiate statin therapy at any age.  Cardiology referral should be considered for patients with CAC scores >=400 or >=75th percentile.  *2018 AHA/ACC/AACVPR/AAPA/ABC/ACPM/ADA/AGS/APhA/ASPC/NLA/PCNA Guideline on the Management of Blood Cholesterol: A Report of the American College of Cardiology/American Heart Association Task  Force on Clinical Practice Guidelines. J Am Coll Cardiol. 2019;73(24):3168-3209.  Electronically Signed: By: Oneil Parchment M.D. On: 07/09/2022 15:52     ______________________________________________________________________________________________       Recent Labs: 07/08/2023: ALT 30 11/23/2023: TSH 1.150  Recent Lipid Panel    Component Value Date/Time   CHOL 142 07/08/2023 0817   TRIG 76 07/08/2023 0817   HDL 72 07/08/2023 0817   CHOLHDL 2.0 07/08/2023 0817   LDLCALC 55 07/08/2023  0817   Physical Exam:    VS:  BP 130/80 (BP Location: Right Arm, Patient Position: Sitting, Cuff Size: Normal)   Pulse (!) 46   Ht 6' (1.829 m)   Wt 187 lb (84.8 kg)   SpO2 98%   BMI 25.36 kg/m     GEN:  Well nourished, well developed in no acute distress HEENT: Normal NECK: No JVD CARDIAC: Regular Bradycardia, no murmurs, rubs, gallops RESPIRATORY:  Clear to auscultation without rales, wheezing or rhonchi  ABDOMEN: Soft, non-tender, non-distended MUSCULOSKELETAL:  No edema; No deformity  SKIN: Warm and dry NEUROLOGIC:  Alert and oriented x 3 PSYCHIATRIC:  Normal affect   ASSESSMENT/PLAN:    Symptomatic PVCs Sinus bradycardia  - PVCs have resoved on BB therapy - Bradycardia with heart rates below 60 bpm, asymptomatic. PVCs have significantly improved with no evidence of significant PVCs on recent heart monitor. Lightheadedness has improved, suggesting no current need for intervention. - Continue carvedilol  for PVC suppression. - If lightheadedness returns, needs ambulatory BP checks; if hypotension will need    HTN - currently at goal; needs to do ambulatory BP monitoring; especially if light-headedness  Atherosclerotic cardiovascular disease with coronary artery and aortic calcification HLD - ASA and LDL goal < 55 - Coronary artery calcium  score in the 70th percentile for age, indicating increased calcified plaque. Plaque buildup in the aorta, left anterior descending artery, circumflex artery, left main artery, and right coronary artery. No obstructive blockages noted. LDL cholesterol is at goal. - Continue aspirin  for secondary prevention of cardiovascular events. - Encourage continued physical activity to reduce risk of coronary artery disease.  Longitudinal care: The evaluation and management services provided today reflect the complexity inherent in caring for this patient, including the ongoing longitudinal relationship and management of multiple chronic conditions  and/or the need for care coordination. The visit required a comprehensive assessment and management plan tailored to the patient's unique needs Time was spent addressing not only the acute concerns but also the broader context of the patient's health, including preventive care, chronic disease management, and care coordination as appropriate.  Complex longitudinal is necessary for conditions including: secondary prevention with CAC and mgmt of now asymptomatic PVC but with tolerated bradycardia   One year unless worsening sx me or APP   Stanly Leavens, MD FASE [facility] Cardiologist [facility]  [address] [place], [place] [address] [phone]  8:26 AM

## 2024-01-28 NOTE — Patient Instructions (Addendum)
 Medication Instructions:  No changes *If you need a refill on your cardiac medications before your next appointment, please call your pharmacy* Continue to monitor BP  Lab Work: None ordered If you have labs (blood work) drawn today and your tests are completely normal, you will receive your results only by: MyChart Message (if you have MyChart) OR A paper copy in the mail If you have any lab test that is abnormal or we need to change your treatment, we will call you to review the results.  Testing/Procedures: None ordered  Follow-Up: At [facility], you and your health needs are our priority.  As part of our continuing mission to provide you with exceptional heart care, our providers are all part of one team.  This team includes your primary Cardiologist (physician) and Advanced Practice Providers or APPs (Physician Assistants and Nurse Practitioners) who all work together to provide you with the care you need, when you need it.  Your next appointment:   1 year(s)  Provider:   Stanly DELENA Leavens, MD  or any APP  We recommend signing up for the patient portal called MyChart.  Sign up information is provided on this After Visit Summary.  MyChart is used to connect with patients for Virtual Visits (Telemedicine).  Patients are able to view lab/test results, encounter notes, upcoming appointments, etc.  Non-urgent messages can be sent to your provider as well.   To learn more about what you can do with MyChart, go to [email or web address].

## 2024-02-14 ENCOUNTER — Other Ambulatory Visit (HOSPITAL_BASED_OUTPATIENT_CLINIC_OR_DEPARTMENT_OTHER): Payer: Self-pay

## 2024-02-14 MED ORDER — COMIRNATY 30 MCG/0.3ML IM SUSY
0.3000 mL | PREFILLED_SYRINGE | Freq: Once | INTRAMUSCULAR | 0 refills | Status: AC
  Filled 2024-02-14: qty 0.3, 1d supply, fill #0

## 2024-02-18 ENCOUNTER — Ambulatory Visit: Admitting: Internal Medicine

## 2024-02-21 ENCOUNTER — Telehealth: Payer: Self-pay | Admitting: Internal Medicine

## 2024-02-21 NOTE — Telephone Encounter (Signed)
 Patient c/o Palpitations:  STAT if patient reporting lightheadedness, shortness of breath, or chest pain  How long have you had palpitations/irregular HR/ Afib? Are you having the symptoms now? About a month   Are you currently experiencing lightheadedness, SOB or CP? No   Do you have a history of afib (atrial fibrillation) or irregular heart rhythm? Yes   Have you checked your BP or HR? (document readings if available): No   Are you experiencing any other symptoms? No   Pt would like to know if he needs medication adjustment for afib. Please advise.

## 2024-02-21 NOTE — Telephone Encounter (Signed)
 Call to patient to discuss concerns. Patient states he last saw Dr. Santo on 01/28/24. Since then his palpitations have worsened. He is having about 3 episodes a day where he experiences fluttering in his chest, some lasting a few seconds and some lasting 5-10 mins. He reports they occur at rest and on exertion. He denies any chest pain, lightheadedness or SOB. He endorses taking his coreg  BID as ordered.   Offered appt with EMERSON Pavy on 02/29/24, patient accepts. Reviewed ED precautions, patient agrees to present to ED if chest pain occurs with or without SOB.

## 2024-02-22 DIAGNOSIS — M25562 Pain in left knee: Secondary | ICD-10-CM | POA: Diagnosis not present

## 2024-02-22 NOTE — Progress Notes (Signed)
 Cardiology Office Note:  .   Date:  02/29/2024  ID:  Alm LITTIE Ferrari, DOB 1950/06/13, MRN [id number] PCP: Okey Carlin Redbird, MD   [facility] Providers Cardiologist:  Stanly DELENA Leavens, MD    History of Present Illness: .   Gerald Griffin is a 73 y.o. male with history of CAD on CT, HTN, HLD, PVC's negative stress test.   Patient added onto my schedule for increased palpitations. Patient has hx of frequent PVCs and inability to tolerate high dose AV nodal therapy due to hypotension (with lightheadedness). Per Dr. Leavens: f/u monitor showed PVC's resolved on BB.   He saw Dr. Leavens 01/28/24 and was doing well. He called in 02/21/24 with increased palpitations 3 episode a day lasting a few seconds to 5-10 min. 2 episodes occurred when he laid down and lasted 10 min. No dizziness, dyspnea,  He says there hasn't been a change in diet, caffeine, alcohol. Only drinks 1 cup coffee in am. Sleeps well. He walks every other day.   ROS:    Studies Reviewed: SABRA    EKG Interpretation Date/Time:  Tuesday February 29 2024 11:51:12 EST Ventricular Rate:  40 PR Interval:  178 QRS Duration:  86 QT Interval:  468 QTC Calculation: 381 R Axis:   55  Text Interpretation: Marked sinus bradycardia When compared with ECG of 23-Nov-2023 11:45, No significant change was found Confirmed by Parthenia Klinefelter [phone]) on 02/29/2024 12:43:31 PM    Prior CV Studies:    Monitor 11/2023 Patient had a minimum heart rate of 36 bpm, maximum heart rate of 115 bpm, and average heart rate of 50 bpm.  Predominant underlying rhythm was sinus rhythm.  One short run of SVT, only 5 beats.  Isolated PACs were rare (<1.0%).  Isolated PVCs were rare (<1.0%).  Triggered and diary events associated with sinus rhythm and sinus bradycardia.   No malignant arrhythmias.   CAC 10/2022 IMPRESSION: 1. Coronary calcium  score of 551. This was 32 percentile for age-, race-, and sex-matched controls.   2.   Descending aortic atherosclerosis.   GXT 02/2022 The study is normal. The study is low risk.   No ST deviation was noted.   LV perfusion is normal. There is no evidence of ischemia. There is no evidence of infarction.   Left ventricular function is normal. Nuclear stress EF: 60 %. The left ventricular ejection fraction is normal (55-65%). End diastolic cavity size is normal. End systolic cavity size is normal.   Prior study not available for comparison.   Normal stress nuclear study with no ischemia or infarction.  Gated ejection fraction 60% with normal wall motion.  Risk Assessment/Calculations:             Physical Exam:   VS:  BP 128/70 (BP Location: Left Arm, Patient Position: Sitting, Cuff Size: Normal)   Pulse (!) 40   Ht 6' (1.829 m)   Wt 188 lb (85.3 kg)   BMI 25.50 kg/m    Orhtostatics: No data found. Wt Readings from Last 3 Encounters:  02/29/24 188 lb (85.3 kg)  01/28/24 187 lb (84.8 kg)  11/23/23 183 lb (83 kg)    GEN: Well nourished, well developed in no acute distress NECK: No JVD; No carotid bruits CARDIAC:  RRR, no murmurs, rubs, gallops RESPIRATORY:  Clear to auscultation without rales, wheezing or rhonchi  ABDOMEN: Soft, non-tender, non-distended EXTREMITIES:  No edema; No deformity   ASSESSMENT AND PLAN: .   Symptomatic PVCs Sinus bradycardia  -  PVCs have resoved on BB therapy - Bradycardia with heart rates below 60 bpm, asymptomatic. PVCs have significantly improved with no evidence of significant PVCs on recent heart monitor. - Continue carvedilol  for PVC suppression. -  Increase in palpitations since here last but thinks they have improved slightly in the past week. Can't increase BB due to bradycardia. Discussed him getting [employer] device or smart watch with EKG monitoring. -check cmet and cbc    HTN - currently at goal    Atherosclerotic cardiovascular disease with coronary artery and aortic calcification HLD - ASA and LDL goal < 55 -  Coronary artery calcium  score in the 70th percentile for age, indicating increased calcified plaque. Plaque buildup in the aorta, left anterior descending artery, circumflex artery, left main artery, and right coronary artery. No obstructive blockages noted. LDL cholesterol is at goal. - Continue aspirin  for secondary prevention of cardiovascular events. - Encourage continued physical activity to reduce risk of coronary artery disease.          Dispo: f/u Dr. Santo in 6 months or sooner if needed.  Signed, Olivia Pavy, PA-C

## 2024-02-29 ENCOUNTER — Encounter: Payer: Self-pay | Admitting: Physician Assistant

## 2024-02-29 ENCOUNTER — Ambulatory Visit: Attending: Physician Assistant | Admitting: Physician Assistant

## 2024-02-29 VITALS — BP 128/70 | HR 40 | Ht 72.0 in | Wt 188.0 lb

## 2024-02-29 DIAGNOSIS — I10 Essential (primary) hypertension: Secondary | ICD-10-CM

## 2024-02-29 DIAGNOSIS — I493 Ventricular premature depolarization: Secondary | ICD-10-CM | POA: Diagnosis not present

## 2024-02-29 DIAGNOSIS — E782 Mixed hyperlipidemia: Secondary | ICD-10-CM

## 2024-02-29 DIAGNOSIS — I2510 Atherosclerotic heart disease of native coronary artery without angina pectoris: Secondary | ICD-10-CM | POA: Diagnosis not present

## 2024-02-29 NOTE — Patient Instructions (Addendum)
 Medication Instructions:   *If you need a refill on your cardiac medications before your next appointment, please call your pharmacy*  Lab Work: CMP CBC-- Today  If you have labs (blood work) drawn today and your tests are completely normal, you will receive your results only by: MyChart Message (if you have MyChart) OR A paper copy in the mail If you have any lab test that is abnormal or we need to change your treatment, we will call you to review the results.  Testing/Procedures: None   Follow-Up: At [facility], you and your health needs are our priority.  As part of our continuing mission to provide you with exceptional heart care, our providers are all part of one team.  This team includes your primary Cardiologist (physician) and Advanced Practice Providers or APPs (Physician Assistants and Nurse Practitioners) who all work together to provide you with the care you need, when you need it.  Your next appointment:  6 months    Provider:  Dr Santo    We recommend signing up for the patient portal called MyChart.  Sign up information is provided on this After Visit Summary.  MyChart is used to connect with patients for Virtual Visits (Telemedicine).  Patients are able to view lab/test results, encounter notes, upcoming appointments, etc.  Non-urgent messages can be sent to your provider as well.   To learn more about what you can do with MyChart, go to [email or web address].

## 2024-03-01 ENCOUNTER — Ambulatory Visit: Payer: Self-pay | Admitting: Physician Assistant

## 2024-03-01 DIAGNOSIS — E875 Hyperkalemia: Secondary | ICD-10-CM

## 2024-03-01 DIAGNOSIS — Z79899 Other long term (current) drug therapy: Secondary | ICD-10-CM

## 2024-03-01 LAB — COMPREHENSIVE METABOLIC PANEL WITH GFR
ALT: 21 IU/L (ref 0–44)
AST: 25 IU/L (ref 0–40)
Albumin: 4.6 g/dL (ref 3.8–4.8)
Alkaline Phosphatase: 73 IU/L (ref 47–123)
BUN/Creatinine Ratio: 13 (ref 10–24)
BUN: 13 mg/dL (ref 8–27)
Bilirubin Total: 0.7 mg/dL (ref 0.0–1.2)
CO2: 25 mmol/L (ref 20–29)
Calcium: 9.7 mg/dL (ref 8.6–10.2)
Chloride: 102 mmol/L (ref 96–106)
Creatinine, Ser: 0.97 mg/dL (ref 0.76–1.27)
Globulin, Total: 2.1 g/dL (ref 1.5–4.5)
Glucose: 88 mg/dL (ref 70–99)
Potassium: 5.8 mmol/L — AB (ref 3.5–5.2)
Sodium: 140 mmol/L (ref 134–144)
Total Protein: 6.7 g/dL (ref 6.0–8.5)
eGFR: 81 mL/min/1.73 (ref >59)

## 2024-03-01 LAB — CBC
Hematocrit: 46 % (ref 37.5–51.0)
Hemoglobin: 15.3 g/dL (ref 13.0–17.7)
MCH: 31.7 pg (ref 26.6–33.0)
MCHC: 33.3 g/dL (ref 31.5–35.7)
MCV: 95 fL (ref 79–97)
Platelets: 197 x10E3/uL (ref 150–450)
RBC: 4.82 x10E6/uL (ref 4.14–5.80)
RDW: 12.6 % (ref 11.6–15.4)
WBC: 7.4 x10E3/uL (ref 3.4–10.8)

## 2024-03-02 ENCOUNTER — Other Ambulatory Visit: Payer: Self-pay | Admitting: Internal Medicine

## 2024-03-03 LAB — BASIC METABOLIC PANEL WITH GFR
BUN/Creatinine Ratio: 13 (ref 10–24)
BUN: 14 mg/dL (ref 8–27)
CO2: 24 mmol/L (ref 20–29)
Calcium: 9.5 mg/dL (ref 8.6–10.2)
Chloride: 104 mmol/L (ref 96–106)
Creatinine, Ser: 1.05 mg/dL (ref 0.76–1.27)
Glucose: 83 mg/dL (ref 70–99)
Potassium: 5.4 mmol/L — ABNORMAL HIGH (ref 3.5–5.2)
Sodium: 142 mmol/L (ref 134–144)
eGFR: 74 mL/min/1.73 (ref >59)

## 2024-03-03 MED ORDER — ROSUVASTATIN CALCIUM 10 MG PO TABS: 10.0000 mg | ORAL_TABLET | Freq: Every day | ORAL | 3 refills | Status: AC

## 2024-03-09 NOTE — Telephone Encounter (Signed)
 Pt requesting a c/b to discuss his blood work

## 2024-03-13 DIAGNOSIS — E875 Hyperkalemia: Secondary | ICD-10-CM | POA: Diagnosis not present

## 2024-03-13 LAB — BASIC METABOLIC PANEL WITH GFR
BUN/Creatinine Ratio: 14 (ref 10–24)
BUN: 14 mg/dL (ref 8–27)
CO2: 26 mmol/L (ref 20–29)
Calcium: 9.9 mg/dL (ref 8.6–10.2)
Chloride: 103 mmol/L (ref 96–106)
Creatinine, Ser: 1.01 mg/dL (ref 0.76–1.27)
Glucose: 50 mg/dL — ABNORMAL LOW (ref 70–99)
Potassium: 5.3 mmol/L — ABNORMAL HIGH (ref 3.5–5.2)
Sodium: 142 mmol/L (ref 134–144)
eGFR: 78 mL/min/1.73 (ref >59)

## 2024-03-16 NOTE — Telephone Encounter (Signed)
 Pt returning call

## 2024-04-12 DIAGNOSIS — H2513 Age-related nuclear cataract, bilateral: Secondary | ICD-10-CM | POA: Diagnosis not present
